# Patient Record
Sex: Female | Born: 2005 | Race: Black or African American | Hispanic: No | Smoking: Never smoker
Health system: Southern US, Community
[De-identification: ages and names within clinical notes are randomized; demographics above are authoritative.]

## PROBLEM LIST (undated history)

## (undated) DIAGNOSIS — T300 Burn of unspecified body region, unspecified degree: Secondary | ICD-10-CM

## (undated) DIAGNOSIS — G932 Benign intracranial hypertension: Secondary | ICD-10-CM

## (undated) DIAGNOSIS — R011 Cardiac murmur, unspecified: Secondary | ICD-10-CM

## (undated) DIAGNOSIS — G43909 Migraine, unspecified, not intractable, without status migrainosus: Secondary | ICD-10-CM

## (undated) DIAGNOSIS — K219 Gastro-esophageal reflux disease without esophagitis: Secondary | ICD-10-CM

## (undated) DIAGNOSIS — G039 Meningitis, unspecified: Secondary | ICD-10-CM

## (undated) DIAGNOSIS — E669 Obesity, unspecified: Secondary | ICD-10-CM

## (undated) HISTORY — DX: Obesity, unspecified: E66.9

## (undated) HISTORY — DX: Benign intracranial hypertension: G93.2

## (undated) HISTORY — DX: Gastro-esophageal reflux disease without esophagitis: K21.9

## (undated) HISTORY — DX: Migraine, unspecified, not intractable, without status migrainosus: G43.909

---
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
## 2023-01-01 ENCOUNTER — Encounter: Payer: Self-pay | Admitting: Plastic Surgery

## 2023-01-01 ENCOUNTER — Ambulatory Visit (INDEPENDENT_AMBULATORY_CARE_PROVIDER_SITE_OTHER): Payer: Medicaid Other | Admitting: Plastic Surgery

## 2023-01-01 DIAGNOSIS — L905 Scar conditions and fibrosis of skin: Secondary | ICD-10-CM
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
## 2023-01-18 ENCOUNTER — Ambulatory Visit
Admission: EM | Admit: 2023-01-18 | Discharge: 2023-01-18 | Disposition: A | Discharge status: Home / Self Care | Payer: Medicaid Other | Attending: Internal Medicine | Admitting: Internal Medicine

## 2023-01-18 DIAGNOSIS — M7989 Other specified soft tissue disorders: Secondary | ICD-10-CM

## 2023-01-18 MED ORDER — FUROSEMIDE 40 MG PO TABS
40.0000 mg | ORAL_TABLET | Freq: Once | ORAL | 0 refills | Status: DC
Start: 2023-01-18 — End: 2023-03-24

## 2023-01-18 NOTE — Discharge Instructions (Addendum)
The clinic will contact you with results of blood work done today if positive.  I have sent 1 dose of Lasix to your pharmacy.  Please follow-up with your PCP at your scheduled appointment in 2 days.  Try to avoid salt intake and elevate your legs as needed.  Please go to the emergency room for any worsening symptoms that develop prior to seeing your PCP.  This includes but is not limited to worsening swelling, calf swelling or pain, shortness of breath, chest pain, difficulty breathing when you lay flat, or any new concerns that arise.  I hope you feel better soon!

## 2023-01-18 NOTE — ED Triage Notes (Signed)
Pt presents to UC w/ c/o swelling and pain to bilateral ankles x3 days. Pt states she was on a fair ride 4 days ago and had to "put a lot of weight on her feet."
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
## 2023-01-19 LAB — BASIC METABOLIC PANEL
BUN/Creatinine Ratio: 15 (ref 9–23)
BUN: 11 mg/dL (ref 6–20)
CO2: 21 mmol/L (ref 20–29)
Calcium: 9.3 mg/dL (ref 8.7–10.2)
Chloride: 106 mmol/L (ref 96–106)
Creatinine, Ser: 0.75 mg/dL (ref 0.57–1.00)
Glucose: 83 mg/dL (ref 70–99)
Potassium: 3.9 mmol/L (ref 3.5–5.2)
Sodium: 139 mmol/L (ref 134–144)
eGFR: 118 mL/min/{1.73_m2} (ref >59)

## 2023-01-21 ENCOUNTER — Other Ambulatory Visit (HOSPITAL_COMMUNITY): Payer: Self-pay

## 2023-01-21 ENCOUNTER — Encounter: Payer: Self-pay | Admitting: Nurse Practitioner

## 2023-01-21 ENCOUNTER — Ambulatory Visit (INDEPENDENT_AMBULATORY_CARE_PROVIDER_SITE_OTHER): Payer: Medicaid Other | Admitting: Nurse Practitioner

## 2023-01-21 VITALS — BP 103/58 | HR 92 | Temp 97.2°F | Wt 259.0 lb

## 2023-01-21 DIAGNOSIS — Z23 Encounter for immunization: Secondary | ICD-10-CM | POA: Diagnosis not present

## 2023-01-21 DIAGNOSIS — R600 Localized edema: Secondary | ICD-10-CM | POA: Diagnosis not present

## 2023-01-21 DIAGNOSIS — K219 Gastro-esophageal reflux disease without esophagitis: Secondary | ICD-10-CM | POA: Diagnosis not present

## 2023-01-21 DIAGNOSIS — Z6841 Body Mass Index (BMI) 40.0 and over, adult: Secondary | ICD-10-CM

## 2023-01-21 MED ORDER — PANTOPRAZOLE SODIUM 40 MG PO TBEC
40.0000 mg | DELAYED_RELEASE_TABLET | Freq: Every day | ORAL | 3 refills | Status: DC
Start: 2023-01-21 — End: 2023-03-24
  Filled 2023-01-21: qty 30, 30d supply, fill #0

## 2023-01-21 MED ORDER — WEGOVY 0.25 MG/0.5ML ~~LOC~~ SOAJ
0.2500 mg | SUBCUTANEOUS | 0 refills | Status: DC
  Filled 2023-01-21: qty 2, 28d supply, fill #0

## 2023-01-21 NOTE — Assessment & Plan Note (Signed)
Pantoprazole 40 mg daily refilled Takes famotidine 20 mg twice daily as needed

## 2023-01-21 NOTE — Patient Instructions (Signed)
Please avoid salty diet, keep your legs elevated when sitting, avoid prolonged sitting or standing ,wear compression as needed to help prevent leg swelling  Please start taking Wegovy 0.25 mg once weekly injection as discussed  It is important that you exercise regularly at least 30 minutes 5 times a week as tolerated  Think about what you will eat, plan ahead. Choose " clean, green, fresh or frozen" over canned, processed or packaged foods which are more sugary, salty and fatty. 70 to 75% of food eaten should be vegetables and fruit. Three meals at set times with snacks allowed between meals, but they must be fruit or vegetables. Aim to eat over a 12 hour period , example 7 am to 7 pm, and STOP after  your last meal of the day. Drink water,generally about 64 ounces per day, no other drink is as healthy. Fruit juice is best enjoyed in a healthy way, by EATING the fruit.  Thanks for choosing [facility] we consider it a privelige to serve you.
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
## 2023-01-21 NOTE — Assessment & Plan Note (Signed)
No edema noted on examination today Encouraged to avoid salty foods keep legs elevated when sitting Wear compression socks and change positions frequently to help prevent edema

## 2023-01-21 NOTE — Assessment & Plan Note (Addendum)
Wt Readings from Last 3 Encounters:  01/21/23 259 lb (117.5 kg) (>99%, Z= 2.56)*  12/24/22 257 lb (116.6 kg) (>99%, Z= 2.54)*  12/17/22 254 lb (115.2 kg) (>99%, Z= 2.52)*   * Growth percentiles are based on CDC (Girls, 2-20 Years) data.   Body mass index is 44.46 kg/m.  Ozempic available at the [facility] medication sent over today Encouraged to start taking Ozempic 0.25 mg once weekly Patient counseled on low-carb modified diet Encouraged engage in regular moderate to vigorous exercise at least 150 minutes weekly Follow-up in 4 weeks

## 2023-01-28 ENCOUNTER — Ambulatory Visit: Payer: Medicaid Other

## 2023-02-03 ENCOUNTER — Ambulatory Visit: Payer: Medicaid Other | Admitting: Neurology

## 2023-02-11 ENCOUNTER — Ambulatory Visit: Payer: Medicaid Other

## 2023-02-18 ENCOUNTER — Ambulatory Visit: Payer: Self-pay | Admitting: Nurse Practitioner

## 2023-02-24 ENCOUNTER — Encounter: Payer: Self-pay | Admitting: Nurse Practitioner

## 2023-02-24 ENCOUNTER — Ambulatory Visit (INDEPENDENT_AMBULATORY_CARE_PROVIDER_SITE_OTHER): Payer: Medicaid Other | Admitting: Nurse Practitioner

## 2023-02-24 ENCOUNTER — Encounter (HOSPITAL_COMMUNITY): Payer: Self-pay

## 2023-02-24 ENCOUNTER — Other Ambulatory Visit (HOSPITAL_COMMUNITY): Payer: Self-pay

## 2023-02-24 ENCOUNTER — Ambulatory Visit: Payer: Self-pay | Admitting: Nurse Practitioner

## 2023-02-24 ENCOUNTER — Other Ambulatory Visit: Payer: Self-pay

## 2023-02-24 VITALS — BP 113/56 | HR 83 | Temp 97.4°F | Ht 64.0 in | Wt 255.0 lb

## 2023-02-24 DIAGNOSIS — H547 Unspecified visual loss: Secondary | ICD-10-CM | POA: Diagnosis not present

## 2023-02-24 DIAGNOSIS — R112 Nausea with vomiting, unspecified: Secondary | ICD-10-CM

## 2023-02-24 DIAGNOSIS — E66813 Obesity, class 3: Secondary | ICD-10-CM | POA: Diagnosis not present

## 2023-02-24 DIAGNOSIS — Z0000 Encounter for general adult medical examination without abnormal findings: Secondary | ICD-10-CM | POA: Diagnosis not present

## 2023-02-24 DIAGNOSIS — Z6841 Body Mass Index (BMI) 40.0 and over, adult: Secondary | ICD-10-CM

## 2023-02-24 DIAGNOSIS — Z13220 Encounter for screening for lipoid disorders: Secondary | ICD-10-CM

## 2023-02-24 DIAGNOSIS — R110 Nausea: Secondary | ICD-10-CM

## 2023-02-24 HISTORY — DX: Nausea with vomiting, unspecified: R11.2

## 2023-02-24 MED ORDER — WEGOVY 0.5 MG/0.5ML ~~LOC~~ SOAJ
0.5000 mg | SUBCUTANEOUS | 0 refills | Status: AC
  Filled 2023-02-24 – 2023-02-25 (×3): qty 2, 28d supply, fill #0

## 2023-02-24 MED ORDER — ONDANSETRON HCL 4 MG PO TABS
4.0000 mg | ORAL_TABLET | Freq: Three times a day (TID) | ORAL | 0 refills | Status: DC | PRN
Start: 2023-02-24 — End: 2023-03-17
  Filled 2023-02-24: qty 10, 4d supply, fill #0

## 2023-02-24 MED ORDER — WEGOVY 1 MG/0.5ML ~~LOC~~ SOAJ
1.0000 mg | SUBCUTANEOUS | 0 refills | Status: DC
Start: 2023-04-01 — End: 2023-05-04
  Filled 2023-02-24 – 2023-04-02 (×3): qty 2, 28d supply, fill #0
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
## 2023-02-24 NOTE — Patient Instructions (Signed)
1. Annual physical exam   2. Screening for lipid disorders  - Lipid panel  3. Class 3 severe obesity due to excess calories with serious comorbidity and body mass index (BMI) of 40.0 to 44.9 in adult (HCC)  - Semaglutide-Weight Management (WEGOVY) 0.5 MG/0.5ML SOAJ; Inject 0.5 mg into the skin once a week for 28 days.  Dispense: 2 mL; Refill: 0 - Semaglutide-Weight Management (WEGOVY) 1 MG/0.5ML SOAJ; Inject 1 mg into the skin once a week for 28 days.  Dispense: 2 mL; Refill: 0     It is important that you exercise regularly at least 30 minutes 5 times a week as tolerated  Think about what you will eat, plan ahead. Choose " clean, green, fresh or frozen" over canned, processed or packaged foods which are more sugary, salty and fatty. 70 to 75% of food eaten should be vegetables and fruit. Three meals at set times with snacks allowed between meals, but they must be fruit or vegetables. Aim to eat over a 12 hour period , example 7 am to 7 pm, and STOP after  your last meal of the day. Drink water,generally about 64 ounces per day, no other drink is as healthy. Fruit juice is best enjoyed in a healthy way, by EATING the fruit.  Thanks for choosing [facility] we consider it a privelige to serve you.

## 2023-02-24 NOTE — Assessment & Plan Note (Signed)
Annual exam as documented.  Counseling done include healthy lifestyle involving committing to 150 minutes of exercise per week, heart healthy diet, and attaining healthy weight. The importance of adequate sleep also discussed.  Regular use of seat belt and home safety were also discussed . Changes in health habits are decided on by patient with goals and time frames set for achieving them. Immunization screening  needs are specifically addressed at this visit.       Screening for lipid disorders  - Lipid panel

## 2023-02-24 NOTE — Assessment & Plan Note (Signed)
Wt Readings from Last 3 Encounters:  02/24/23 255 lb (115.7 kg) (>99%, Z= 2.54)*  01/21/23 259 lb (117.5 kg) (>99%, Z= 2.56)*  12/24/22 257 lb (116.6 kg) (>99%, Z= 2.54)*   * Growth percentiles are based on CDC (Girls, 2-20 Years) data.   Body mass index is 43.77 kg/m.  She has lost 4 pounds since her last visit Patient encouraged to continue to intensify her efforts at losing weight Start  - Semaglutide-Weight Management (WEGOVY) 0.5 MG/0.5ML SOAJ; Inject 0.5 mg into the skin once a week for 28 days.  Dispense: 2 mL; Refill: 0 - Semaglutide-Weight Management (WEGOVY) 1 MG/0.5ML SOAJ; Inject 1 mg into the skin once a week for 28 days.  Dispense: 2 mL; Refill: 0  Patient counseled on low-carb modified diet encouraged to engage in regular moderate to vigorous exercise at least 150 minutes weekly Follow-up in 3 months

## 2023-02-24 NOTE — Assessment & Plan Note (Signed)
Patient encouraged to follow-up with the ophthalmologist

## 2023-02-24 NOTE — Assessment & Plan Note (Signed)
ondansetron (ZOFRAN) 4 MG tablet; Take 1 tablet (4 mg total) by mouth every 8 (eight) hours as needed for nausea or vomiting.  Dispense: 10 tablet; Refill: 0  Patient encouraged to avoid fatty fried food, eat smaller portion of meals to help prevent nausea while taking [facility]

## 2023-02-25 ENCOUNTER — Other Ambulatory Visit (HOSPITAL_COMMUNITY): Payer: Self-pay

## 2023-02-25 ENCOUNTER — Other Ambulatory Visit: Payer: Self-pay

## 2023-02-25 LAB — LIPID PANEL
Chol/HDL Ratio: 3.9 {ratio} (ref 0.0–4.4)
Cholesterol, Total: 143 mg/dL (ref 100–169)
HDL: 37 mg/dL — ABNORMAL LOW (ref >39)
LDL Chol Calc (NIH): 92 mg/dL (ref 0–109)
Triglycerides: 68 mg/dL (ref 0–89)
VLDL Cholesterol Cal: 14 mg/dL (ref 5–40)

## 2023-02-26 ENCOUNTER — Other Ambulatory Visit: Payer: Self-pay

## 2023-02-27 ENCOUNTER — Other Ambulatory Visit (HOSPITAL_BASED_OUTPATIENT_CLINIC_OR_DEPARTMENT_OTHER): Payer: Self-pay

## 2023-03-02 ENCOUNTER — Other Ambulatory Visit (HOSPITAL_COMMUNITY): Payer: Self-pay

## 2023-03-04 ENCOUNTER — Other Ambulatory Visit: Payer: Self-pay

## 2023-03-05 ENCOUNTER — Ambulatory Visit (INDEPENDENT_AMBULATORY_CARE_PROVIDER_SITE_OTHER): Payer: Medicaid Other

## 2023-03-05 ENCOUNTER — Ambulatory Visit
Admission: EM | Admit: 2023-03-05 | Discharge: 2023-03-05 | Disposition: A | Discharge status: Home / Self Care | Payer: Medicaid Other | Attending: Internal Medicine | Admitting: Internal Medicine

## 2023-03-05 DIAGNOSIS — J069 Acute upper respiratory infection, unspecified: Secondary | ICD-10-CM | POA: Diagnosis not present

## 2023-03-05 DIAGNOSIS — R0602 Shortness of breath: Secondary | ICD-10-CM | POA: Insufficient documentation

## 2023-03-05 DIAGNOSIS — J029 Acute pharyngitis, unspecified: Secondary | ICD-10-CM | POA: Insufficient documentation

## 2023-03-05 DIAGNOSIS — R051 Acute cough: Secondary | ICD-10-CM

## 2023-03-05 DIAGNOSIS — Z1152 Encounter for screening for COVID-19: Secondary | ICD-10-CM | POA: Insufficient documentation

## 2023-03-05 LAB — POCT RAPID STREP A (OFFICE): Rapid Strep A Screen: NEGATIVE

## 2023-03-05 MED ORDER — BENZONATATE 200 MG PO CAPS: 200.0000 mg | ORAL_CAPSULE | Freq: Three times a day (TID) | ORAL | 0 refills | Status: DC | PRN

## 2023-03-05 MED ORDER — ALBUTEROL SULFATE HFA 108 (90 BASE) MCG/ACT IN AERS
1.0000 | INHALATION_SPRAY | Freq: Four times a day (QID) | RESPIRATORY_TRACT | 0 refills | Status: AC | PRN
Start: 2023-03-05 — End: ?

## 2023-03-05 NOTE — Discharge Instructions (Signed)
The clinic will contact you with results of the COVID test done today if positive.  May use albuterol inhaler as needed for shortness of breath.  Tessalon as needed for cough.  Lots of rest and fluids.  Please follow-up with your PCP in 2 to 3 days for recheck.  Please go to the ER for any worsening symptoms.  I hope you feel better soon!

## 2023-03-05 NOTE — ED Triage Notes (Signed)
Pt presents with c/o chills, sore throat, body aches x 4 days.  Denies fever. Has no energy, nasal congestion and headaches.   Home interventions:tylenol
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
## 2023-03-06 LAB — SARS CORONAVIRUS 2 (TAT 6-24 HRS): SARS Coronavirus 2: NEGATIVE

## 2023-03-08 LAB — CULTURE, GROUP A STREP (THRC)

## 2023-03-16 ENCOUNTER — Other Ambulatory Visit: Payer: Self-pay

## 2023-03-16 ENCOUNTER — Encounter (HOSPITAL_COMMUNITY): Payer: Self-pay | Admitting: Emergency Medicine

## 2023-03-16 ENCOUNTER — Emergency Department (HOSPITAL_COMMUNITY)
Admission: EM | Admit: 2023-03-16 | Discharge: 2023-03-16 | Disposition: A | Discharge status: Home / Self Care | Payer: Medicaid Other | Attending: Emergency Medicine | Admitting: Emergency Medicine

## 2023-03-16 DIAGNOSIS — R109 Unspecified abdominal pain: Secondary | ICD-10-CM | POA: Diagnosis present

## 2023-03-16 DIAGNOSIS — K2970 Gastritis, unspecified, without bleeding: Secondary | ICD-10-CM | POA: Insufficient documentation

## 2023-03-16 LAB — COMPREHENSIVE METABOLIC PANEL
ALT: 21 U/L (ref 0–44)
AST: 23 U/L (ref 15–41)
Albumin: 4 g/dL (ref 3.5–5.0)
Alkaline Phosphatase: 50 U/L (ref 38–126)
Anion gap: 13 (ref 5–15)
BUN: 10 mg/dL (ref 6–20)
CO2: 16 mmol/L — ABNORMAL LOW (ref 22–32)
Calcium: 9.4 mg/dL (ref 8.9–10.3)
Chloride: 106 mmol/L (ref 98–111)
Creatinine, Ser: 0.63 mg/dL (ref 0.44–1.00)
GFR, Estimated: >60 mL/min (ref >60)
Glucose, Bld: 90 mg/dL (ref 70–99)
Potassium: 4.2 mmol/L (ref 3.5–5.1)
Sodium: 135 mmol/L (ref 135–145)
Total Bilirubin: 1 mg/dL (ref <1.2)
Total Protein: 8 g/dL (ref 6.5–8.1)

## 2023-03-16 LAB — CBC WITH DIFFERENTIAL/PLATELET
Abs Immature Granulocytes: 0.02 10*3/uL (ref 0.00–0.07)
Basophils Absolute: 0 10*3/uL (ref 0.0–0.1)
Basophils Relative: 0 %
Eosinophils Absolute: 0 10*3/uL (ref 0.0–0.5)
Eosinophils Relative: 0 %
HCT: 44.2 % (ref 36.0–46.0)
Hemoglobin: 14 g/dL (ref 12.0–15.0)
Immature Granulocytes: 0 %
Lymphocytes Relative: 21 %
Lymphs Abs: 1.7 10*3/uL (ref 0.7–4.0)
MCH: 29.4 pg (ref 26.0–34.0)
MCHC: 31.7 g/dL (ref 30.0–36.0)
MCV: 92.9 fL (ref 80.0–100.0)
Monocytes Absolute: 0.4 10*3/uL (ref 0.1–1.0)
Monocytes Relative: 5 %
Neutro Abs: 5.9 10*3/uL (ref 1.7–7.7)
Neutrophils Relative %: 74 %
Platelets: 216 10*3/uL (ref 150–400)
RBC: 4.76 MIL/uL (ref 3.87–5.11)
RDW: 12.1 % (ref 11.5–15.5)
WBC: 8 10*3/uL (ref 4.0–10.5)
nRBC: 0 % (ref 0.0–0.2)

## 2023-03-16 LAB — LIPASE, BLOOD: Lipase: 25 U/L (ref 11–51)

## 2023-03-16 MED ORDER — HYDROMORPHONE HCL 1 MG/ML IJ SOLN
0.5000 mg | Freq: Once | INTRAMUSCULAR | Status: AC
  Administered 2023-03-16: 0.5 mg via INTRAVENOUS
  Filled 2023-03-16: qty 1

## 2023-03-16 MED ORDER — METOCLOPRAMIDE HCL 5 MG/ML IJ SOLN
5.0000 mg | Freq: Once | INTRAMUSCULAR | Status: AC
Start: 2023-03-16 — End: 2023-03-16
  Administered 2023-03-16: 5 mg via INTRAVENOUS
  Filled 2023-03-16: qty 2

## 2023-03-16 MED ORDER — ALUM & MAG HYDROXIDE-SIMETH 200-200-20 MG/5ML PO SUSP
30.0000 mL | Freq: Once | ORAL | Status: AC
  Administered 2023-03-16: 30 mL via ORAL
  Filled 2023-03-16: qty 30

## 2023-03-16 MED ORDER — LIDOCAINE VISCOUS HCL 2 % MT SOLN
15.0000 mL | Freq: Once | OROMUCOSAL | Status: AC
  Administered 2023-03-16: 15 mL via ORAL
  Filled 2023-03-16: qty 15

## 2023-03-16 MED ORDER — SUCRALFATE 1 G PO TABS
1.0000 g | ORAL_TABLET | Freq: Three times a day (TID) | ORAL | 0 refills | Status: AC
Start: 2023-03-16 — End: 2023-06-10

## 2023-03-16 MED ORDER — DIPHENHYDRAMINE HCL 50 MG/ML IJ SOLN
25.0000 mg | Freq: Once | INTRAMUSCULAR | Status: AC
  Administered 2023-03-16: 25 mg via INTRAVENOUS
  Filled 2023-03-16: qty 1

## 2023-03-16 MED ORDER — FAMOTIDINE IN NACL 20-0.9 MG/50ML-% IV SOLN
20.0000 mg | Freq: Once | INTRAVENOUS | Status: AC
  Administered 2023-03-16: 20 mg via INTRAVENOUS
  Filled 2023-03-16: qty 50

## 2023-03-16 MED ORDER — SUCRALFATE 1 GM/10ML PO SUSP
1.0000 g | Freq: Three times a day (TID) | ORAL | Status: DC
  Administered 2023-03-16: 1 g via ORAL
  Filled 2023-03-16: qty 10

## 2023-03-16 NOTE — ED Provider Triage Note (Signed)
Emergency Medicine Provider Triage Evaluation Note  Natasha Sampson , a 17 y.o. female  was evaluated in triage.  Pt complains of generalized abdominal pain and burning epigastric pain that started yesterday following eating spaghetti with red sauce.  She reports that she forgot to take her omeprazole as prescribed that day.  She tried taking Meprazole following spaghetti with no improvement to her symptoms.  She then tried to take Pepcid this morning with no improvement to her symptoms and now has generalized abdominal pain and a "burning" in her chest  Review of Systems  Positive: Generalized abdominal pain, "burning" epigastric pain with nausea Negative: Vomiting diarrhea fever shortness of breath  Physical Exam  BP 124/86 (BP Location: Left Arm)   Pulse 85   Temp 98 F (36.7 C) (Oral)   Resp 17   Ht 5\' 4"  (1.626 m)   Wt 115.7 kg   LMP 02/11/2023 (Exact Date)   SpO2 100%   BMI 43.77 kg/m  Gen:   Awake, no distress   Resp:  Normal effort  MSK:   Moves extremities without difficulty  Other:    Medical Decision Making  Medically screening exam initiated at 2:17 PM.  Appropriate orders placed.  Natasha Sampson was informed that the remainder of the evaluation will be completed by another provider, this initial triage assessment does not replace that evaluation, and the importance of remaining in the ED until their evaluation is complete.     Natasha Sheen, PA 03/16/23 1419

## 2023-03-16 NOTE — ED Triage Notes (Signed)
Patient arrives in wheelchair by POV c/o GERD last night after eating spaghetti. C/o burning sensation in throat and took protonix last night around 3am. States this morning tried drinking some tea and took pepcid.

## 2023-03-16 NOTE — ED Provider Notes (Signed)
[place] EMERGENCY DEPARTMENT AT [facility] Provider Note   CSN: [id number] Arrival date & time: 03/16/23  1317     History  Chief Complaint  Patient presents with   Gastroesophageal Reflux    Natasha Sampson is a 17 y.o. female with hx of reflux gastritis, obesity, presenting to ED with abdominal pain.  Patient reports that she ate spaghetti with sauce last night and thinks that this "set off" her gastritis.  She typically takes pantoprazole as well as Pepcid and took his medicine as well as other over-the-counter medicines last night but has had severe unrelenting burning epigastric pain.  She says the pain is 10 out of 10.  She was nauseated and dry heaves and vomited earlier today  HPI     Home Medications Prior to Admission medications   Medication Sig Start Date End Date Taking? Authorizing Provider  sucralfate (CARAFATE) 1 g tablet Take 1 tablet (1 g total) by mouth 3 (three) times daily. Take with meals 03/16/23 04/15/23 Yes Camala Talwar, Kermit Balo, MD  acetaZOLAMIDE ER (DIAMOX) 500 MG capsule Take 1 capsule (500 mg total) by mouth 2 (two) times daily. 12/17/22 12/12/23  Windell Norfolk, MD  albuterol (VENTOLIN HFA) 108 (90 Base) MCG/ACT inhaler Inhale 1-2 puffs into the lungs every 6 (six) hours as needed for wheezing or shortness of breath. 03/05/23   Radford Pax, NP  benzonatate (TESSALON) 200 MG capsule Take 1 capsule (200 mg total) by mouth 3 (three) times daily as needed. 03/05/23   Radford Pax, NP  famotidine (PEPCID) 20 MG tablet Take 20 mg by mouth 2 (two) times daily as needed for heartburn or indigestion. 09/24/22   [provider]  furosemide (LASIX) 40 MG tablet Take 1 tablet (40 mg total) by mouth once for 1 dose. Patient not taking: Reported on 01/21/2023 01/18/23 01/21/23  Radford Pax, NP  ondansetron (ZOFRAN) 4 MG tablet Take 1 tablet (4 mg total) by mouth every 8 (eight) hours as needed for nausea or vomiting. 02/24/23   Paseda, Baird Kay, FNP   pantoprazole (PROTONIX) 40 MG tablet Take 1 tablet (40 mg total) by mouth daily. 01/21/23   Donell Beers, FNP  Semaglutide-Weight Management (WEGOVY) 0.5 MG/0.5ML SOAJ Inject 0.5 mg into the skin once a week for 28 days. 02/25/23 03/30/23  Donell Beers, FNP  Semaglutide-Weight Management (WEGOVY) 1 MG/0.5ML SOAJ Inject 1 mg into the skin once a week for 28 days. 04/01/23 04/29/23  Donell Beers, FNP  SUMAtriptan (IMITREX) 100 MG tablet Take 1 tablet (100 mg total) by mouth every 2 (two) hours as needed for migraine. May repeat in 2 hours if headache persists or recurs. 12/17/22   Windell Norfolk, MD  topiramate (TOPAMAX) 100 MG tablet Take 1 tablet (100 mg total) by mouth 2 (two) times daily. 12/17/22 09/13/23  Windell Norfolk, MD  TYLENOL 500 MG tablet Take 500-1,000 mg by mouth every 6 (six) hours as needed for mild pain or headache.    [provider]      Allergies    Gadavist [gadobutrol]    Review of Systems   Review of Systems  Physical Exam Updated Vital Signs BP (!) 120/96 (BP Location: Right Arm)   Pulse 80   Temp 98.8 F (37.1 C) (Oral)   Resp 18   Ht 5\' 4"  (1.626 m)   Wt 115.7 kg   LMP 02/11/2023 (Exact Date)   SpO2 100%   BMI 43.77 kg/m  Physical Exam  Constitutional:      General: She is in acute distress.     Appearance: She is obese.     Comments: Tearful  HENT:     Head: Normocephalic and atraumatic.  Eyes:     Conjunctiva/sclera: Conjunctivae normal.     Pupils: Pupils are equal, round, and reactive to light.  Cardiovascular:     Rate and Rhythm: Regular rhythm. Tachycardia present.  Pulmonary:     Effort: Pulmonary effort is normal. No respiratory distress.  Abdominal:     General: There is no distension.     Tenderness: There is abdominal tenderness.  Skin:    General: Skin is warm and dry.  Neurological:     General: No focal deficit present.     Mental Status: She is alert. Mental status is at baseline.  Psychiatric:         Mood and Affect: Mood normal.        Behavior: Behavior normal.     ED Results / Procedures / Treatments   Labs (all labs ordered are listed, but only abnormal results are displayed) Labs Reviewed  COMPREHENSIVE METABOLIC PANEL - Abnormal; Notable for the following components:      Result Value   CO2 16 (*)    All other components within normal limits  CBC WITH DIFFERENTIAL/PLATELET  LIPASE, BLOOD  HCG, SERUM, QUALITATIVE    EKG None  Radiology No results found.  Procedures Procedures    Medications Ordered in ED Medications  sucralfate (CARAFATE) 1 GM/10ML suspension 1 g (1 g Oral Given 03/16/23 1719)  alum & mag hydroxide-simeth (MAALOX/MYLANTA) 200-200-20 MG/5ML suspension 30 mL (30 mLs Oral Given 03/16/23 1428)    And  lidocaine (XYLOCAINE) 2 % viscous mouth solution 15 mL (15 mLs Oral Given 03/16/23 1428)  famotidine (PEPCID) IVPB 20 mg premix (0 mg Intravenous Stopped 03/16/23 1934)  HYDROmorphone (DILAUDID) injection 0.5 mg (0.5 mg Intravenous Given 03/16/23 1717)  metoCLOPramide (REGLAN) injection 5 mg (5 mg Intravenous Given 03/16/23 1717)  diphenhydrAMINE (BENADRYL) injection 25 mg (25 mg Intravenous Given 03/16/23 1738)    ED Course/ Medical Decision Making/ A&P Clinical Course as of 03/16/23 2349  Mon Mar 16, 2023  1907 Patient is feeling significantly better after medications and easily tolerating p.o.  I will discharge her at this time.  Will prescribe Carafate in addition to her PPI and H2 blocker that she is currently taking [MT]    Clinical Course User Index [MT] Aislyn Hayse, Kermit Balo, MD                                 Medical Decision Making Amount and/or Complexity of Data Reviewed Labs: ordered.  Risk Prescription drug management.   This patient presents to the ED with concern for worsening epigastric pain. This involves an extensive number of treatment options, and is a complaint that carries with it a high risk of complications and morbidity.  The  differential diagnosis includes gastritis versus peptic ulcer versus esophagitis versus pancreatitis versus acute biliary disease versus other  Co-morbidities that complicate the patient evaluation: History of reflux at high risk of gastric irritation  Additional history obtained from patient's spouse at bedside  I ordered and personally interpreted labs.  The pertinent results include: No emergent findings.  Pregnancy test hemolyzed, but patient is not report any concern for pregnancy at this time, and I do not believe that this is the likely cause of  her symptoms  The patient was given a GI cocktail with Maalox and viscous lidocaine in triage.  She reports no improvement with this medication.  Subsequently I have ordered IV medications including IV Pepcid, small dose 0.5 mg hydromorphone, 5 mg Reglan, and liquid Carafate  I have reviewed the patients home medicines and have made adjustments as needed  Test Considered: She was reporting some chest discomfort after vomiting and more suspect is related to esophageal irritation, have a lower suspicion for ACS, PE, pneumothorax, or other life-threatening cardiothoracic issue. I do not see an indication for x-ray imaging or CT angiogram at this time  After the interventions noted above, I reevaluated the patient and found that they have: improved   Dispostion:  After consideration of the diagnostic results and the patients response to treatment, I feel that the patent would benefit from outpatient follow-up.  I recommended GI follow-up given the degree and severity of her ongoing reflux symptoms and gastritis.         Final Clinical Impression(s) / ED Diagnoses Final diagnoses:  Gastritis, presence of bleeding unspecified, unspecified chronicity, unspecified gastritis type    Rx / DC Orders ED Discharge Orders          Ordered    sucralfate (CARAFATE) 1 g tablet  3 times daily        03/16/23 1908              Terald Sleeper, MD 03/16/23 2349

## 2023-03-17 ENCOUNTER — Telehealth: Payer: Medicaid Other | Admitting: Physician Assistant

## 2023-03-17 DIAGNOSIS — R112 Nausea with vomiting, unspecified: Secondary | ICD-10-CM | POA: Diagnosis not present

## 2023-03-17 MED ORDER — ONDANSETRON 4 MG PO TBDP: 4.0000 mg | ORAL_TABLET | Freq: Three times a day (TID) | ORAL | 0 refills | Status: DC | PRN

## 2023-03-17 NOTE — Progress Notes (Signed)
Virtual Visit Consent   Natasha Sampson, you are scheduled for a virtual visit with a [facility] provider today. Just as with appointments in the office, your consent must be obtained to participate. Your consent will be active for this visit and any virtual visit you may have with one of our providers in the next 365 days. If you have a MyChart account, a copy of this consent can be sent to you electronically.  As this is a virtual visit, video technology does not allow for your provider to perform a traditional examination. This may limit your provider's ability to fully assess your condition. If your provider identifies any concerns that need to be evaluated in person or the need to arrange testing (such as labs, EKG, etc.), we will make arrangements to do so. Although advances in technology are sophisticated, we cannot ensure that it will always work on either your end or our end. If the connection with a video visit is poor, the visit may have to be switched to a telephone visit. With either a video or telephone visit, we are not always able to ensure that we have a secure connection.  By engaging in this virtual visit, you consent to the provision of healthcare and authorize for your insurance to be billed (if applicable) for the services provided during this visit. Depending on your insurance coverage, you may receive a charge related to this service.  I need to obtain your verbal consent now. Are you willing to proceed with your visit today? [name] has provided verbal consent on 03/20/2023 for a virtual visit (video or telephone). Margaretann Loveless, PA-C  Date: 03/20/2023 7:45 AM  Virtual Visit via Video Note   I, Margaretann Loveless, connected with  [name]  ([id number], 2006/02/24) on 03/20/23 at  9:30 AM EST by a video-enabled telemedicine application and verified that I am speaking with the correct person using two identifiers.  Location: Patient: Virtual Visit Location  Patient: Home Provider: Virtual Visit Location Provider: Home Office   I discussed the limitations of evaluation and management by telemedicine and the availability of in person appointments. The patient expressed understanding and agreed to proceed.    History of Present Illness: Natasha Sampson is a 17 y.o. who identifies as a female who was assigned female at birth, and is being seen today for nausea and vomiting in the setting of possible acute gastritis. Has been having issues of gastritis/PUD/GERD for a while now. Was seen in the ER last night, 03/16/23, for the same issue. She was prescribed Sucralfate to start taking along with her Pantoprazole. It was also recommended for her to follow up with GI due to frequency and severity of her gastritis symptoms. Since the ER visit she has continued to have frequent nausea, vomiting, and diarrhea. Even struggling to keep down liquids. She has take regular zofran but has vomited that up. She has taken 1 sucralfate, but that did not stay down either. She has been scared to take pantoprazole and sucralfate on an empty stomach. She does have increased fatigue and reports feeling hungry and thirsty. Denies hematochezia, melena, hematemesis.   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   Need for influenza vaccination 05/30/2022   Gastroesophageal reflux disease 05/30/2022   Anxiety and depression 05/30/2022   Menorrhagia with regular cycle 05/30/2022   Migraine without aura and without status migrainosus, not intractable 05/30/2022   History of seizures as a child 05/30/2022   Obesity 05/30/2022   Blurry vision,  bilateral 05/30/2022   Severe episode of recurrent major depressive disorder, without psychotic features (HCC) 05/13/2019   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   Altered mental status 01/06/2013   Muscular pain 01/06/2013   Fever, unspecified 01/06/2013   Headache 01/06/2013   Transient alteration of awareness 01/06/2013    Class: Acute    Allergies:  Allergies  Allergen Reactions   Gadavist [Gadobutrol] Other (See Comments)    Sneezing,watery eyes- post injection   Medications:  Current Outpatient Medications:    ondansetron (ZOFRAN-ODT) 4 MG disintegrating tablet, Take 1-2 tablets (4-8 mg total) by mouth every 8 (eight) hours as needed., Disp: 20 tablet, Rfl: 0   acetaZOLAMIDE ER (DIAMOX) 500 MG capsule, Take 1 capsule (500 mg total) by mouth 2 (two) times daily., Disp: 180 capsule, Rfl: 3   albuterol (VENTOLIN HFA) 108 (90 Base) MCG/ACT inhaler, Inhale 1-2 puffs into the lungs every 6 (six) hours as needed for wheezing or shortness of breath., Disp: 1 each, Rfl: 0   benzonatate (TESSALON) 200 MG capsule, Take 1 capsule (200 mg total) by mouth 3 (three) times daily as needed., Disp: 20 capsule, Rfl: 0   famotidine (PEPCID) 20 MG tablet, Take 20 mg by mouth 2 (two) times daily as needed for heartburn or indigestion., Disp: , Rfl:    furosemide (LASIX) 40 MG tablet, Take 1 tablet (40 mg total) by mouth once for 1 dose. (Patient not taking: Reported on 01/21/2023), Disp: 1 tablet, Rfl: 0   pantoprazole (PROTONIX) 40 MG tablet, Take 1 tablet (40 mg total) by mouth daily., Disp: 30 tablet, Rfl: 3   Semaglutide-Weight Management (WEGOVY) 0.5 MG/0.5ML SOAJ, Inject 0.5 mg into the skin once a week for 28 days., Disp: 2 mL, Rfl: 0   [START ON 04/01/2023] Semaglutide-Weight Management (WEGOVY) 1 MG/0.5ML SOAJ, Inject 1 mg into the skin once a week for 28 days., Disp: 2 mL, Rfl: 0   sucralfate (CARAFATE) 1 g tablet, Take 1 tablet (1 g total) by mouth 3 (three) times daily. Take with meals, Disp: 90  tablet, Rfl: 0   SUMAtriptan (IMITREX) 100 MG tablet, Take 1 tablet (100 mg total) by mouth every 2 (two) hours as needed for migraine. May repeat in 2 hours if headache persists or recurs., Disp: 10 tablet, Rfl: 3   topiramate (TOPAMAX) 100 MG tablet, Take 1 tablet (100 mg total) by mouth 2 (two) times daily., Disp: 180 tablet, Rfl: 2   TYLENOL 500 MG tablet, Take 500-1,000 mg by mouth every 6 (six) hours as needed for mild pain or headache., Disp: , Rfl:   Observations/Objective: Patient is well-developed, well-nourished in no acute distress.  Resting comfortably at home.  Head is normocephalic, atraumatic.  No labored breathing.  Speech is clear and coherent with logical content.  Patient is alert and oriented at baseline.    Assessment and Plan: 1. Nausea and vomiting, unspecified vomiting type - ondansetron (ZOFRAN-ODT) 4 MG disintegrating tablet; Take 1-2 tablets (4-8 mg total) by mouth every 8 (eight) hours as needed.  Dispense: 20 tablet; Refill: 0  - Change Zofran to ODT - Continue Sucralfate and Pantoprazole - Liquid  diet today, push fluids, electrolyte beverages - Increase diet slowly to bland diet tomorrow then increase as tolerated back to normal - Discussed following up with PCP for GI referral - Strict ER precautions to return if unable to maintain fluids over the next 24 hours despite change in medication  Follow Up Instructions: I discussed the assessment and treatment plan with the patient. The patient was provided an opportunity to ask questions and all were answered. The patient agreed with the plan and demonstrated an understanding of the instructions.  A copy of instructions were sent to the patient via MyChart unless otherwise noted below.    The patient was advised to call back or seek an in-person evaluation if the symptoms worsen or if the condition fails to improve as anticipated.    Margaretann Loveless, PA-C

## 2023-03-20 NOTE — Patient Instructions (Signed)
Bluford Kaufmann, thank you for joining Margaretann Loveless, PA-C for today's virtual visit.  While this provider is not your primary care provider (PCP), if your PCP is located in our provider database this encounter information will be shared with them immediately following your visit.   A [facility] MyChart account gives you access to today's visit and all your visits, tests, and labs performed at [facility] " click here if you don't have a [facility] MyChart account or go to [email or web address]  Consent: (Patient) Natasha Sampson provided verbal consent for this virtual visit at the beginning of the encounter.  Current Medications:  Current Outpatient Medications:    ondansetron (ZOFRAN-ODT) 4 MG disintegrating tablet, Take 1-2 tablets (4-8 mg total) by mouth every 8 (eight) hours as needed., Disp: 20 tablet, Rfl: 0   acetaZOLAMIDE ER (DIAMOX) 500 MG capsule, Take 1 capsule (500 mg total) by mouth 2 (two) times daily., Disp: 180 capsule, Rfl: 3   albuterol (VENTOLIN HFA) 108 (90 Base) MCG/ACT inhaler, Inhale 1-2 puffs into the lungs every 6 (six) hours as needed for wheezing or shortness of breath., Disp: 1 each, Rfl: 0   benzonatate (TESSALON) 200 MG capsule, Take 1 capsule (200 mg total) by mouth 3 (three) times daily as needed., Disp: 20 capsule, Rfl: 0   famotidine (PEPCID) 20 MG tablet, Take 20 mg by mouth 2 (two) times daily as needed for heartburn or indigestion., Disp: , Rfl:    furosemide (LASIX) 40 MG tablet, Take 1 tablet (40 mg total) by mouth once for 1 dose. (Patient not taking: Reported on 01/21/2023), Disp: 1 tablet, Rfl: 0   pantoprazole (PROTONIX) 40 MG tablet, Take 1 tablet (40 mg total) by mouth daily., Disp: 30 tablet, Rfl: 3   Semaglutide-Weight Management (WEGOVY) 0.5 MG/0.5ML SOAJ, Inject 0.5 mg into the skin once a week for 28 days., Disp: 2 mL, Rfl: 0   [START ON 04/01/2023] Semaglutide-Weight Management (WEGOVY) 1 MG/0.5ML SOAJ, Inject 1 mg into  the skin once a week for 28 days., Disp: 2 mL, Rfl: 0   sucralfate (CARAFATE) 1 g tablet, Take 1 tablet (1 g total) by mouth 3 (three) times daily. Take with meals, Disp: 90 tablet, Rfl: 0   SUMAtriptan (IMITREX) 100 MG tablet, Take 1 tablet (100 mg total) by mouth every 2 (two) hours as needed for migraine. May repeat in 2 hours if headache persists or recurs., Disp: 10 tablet, Rfl: 3   topiramate (TOPAMAX) 100 MG tablet, Take 1 tablet (100 mg total) by mouth 2 (two) times daily., Disp: 180 tablet, Rfl: 2   TYLENOL 500 MG tablet, Take 500-1,000 mg by mouth every 6 (six) hours as needed for mild pain or headache., Disp: , Rfl:    Medications ordered in this encounter:  Meds ordered this encounter  Medications   ondansetron (ZOFRAN-ODT) 4 MG disintegrating tablet    Sig: Take 1-2 tablets (4-8 mg total) by mouth every 8 (eight) hours as needed.    Dispense:  20 tablet    Refill:  0    Order Specific Question:   Supervising Provider    Answer:   Merrilee Jansky [id number]     *If you need refills on other medications prior to your next appointment, please contact your pharmacy*  Follow-Up: Call back or seek an in-person evaluation if the symptoms worsen or if the condition fails to improve as anticipated.  [facility] [phone]  Other Instructions Gastritis, Adult Gastritis is  inflammation of the stomach. There are two kinds of gastritis: Acute gastritis. This kind develops suddenly. Chronic gastritis. This kind is much more common. It develops slowly and lasts for a long time. Gastritis happens when the lining of the stomach becomes weak or gets damaged. Without treatment, gastritis can lead to stomach bleeding and ulcers. What are the causes? This condition may be caused by: An infection. Drinking too much alcohol. Certain medicines. These include steroids, antibiotics, and some over-the-counter medicines, such as aspirin or ibuprofen. Having too much acid in the  stomach. Having a disease of the stomach. Other causes may include: An allergic reaction. Some cancer treatments (radiation). Smoking cigarettes or the use of products that contain nicotine or tobacco. In some cases, the cause of this condition is not known. What increases the risk? Having a disease of the intestines. Having a disease in which the body's immune system attacks the body (autoimmune disease), such as Crohn's disease. Using aspirin or ibuprofen and other NSAIDs to treat other conditions, such as heart disease or chronic pain. Stress. What are the signs or symptoms? Symptoms of this condition include: Pain or a burning sensation in the upper abdomen. Nausea. Vomiting. An uncomfortable feeling of fullness after eating. Weight loss. Bad breath. Blood in your vomit or stool (feces). In some cases, there are no symptoms. How is this diagnosed? This condition may be diagnosed based on your medical history, a physical exam, and tests. Tests may include: Your medical history and a description of your symptoms. A physical exam. Tests. These can include: Blood tests. Stool tests. A test in which a thin, flexible instrument with a light and a camera is passed down the esophagus and into the stomach (upper endoscopy). A test in which a tissue sample is removed to look at it under a microscope (biopsy). How is this treated? This condition may be treated with medicines. The medicines that are used vary depending on the cause of the gastritis. If the condition is caused by a bacterial infection, you may be given antibiotic medicines. If the condition is caused by too much acid in the stomach, you may be given medicines called H2 blockers, proton pump inhibitors, or antacids. Treatment may also involve stopping the use of certain medicines such as aspirin or ibuprofen and other NSAIDs. Follow these instructions at home: Medicines Take over-the-counter and prescription medicines  only as told by your health care provider. If you were prescribed an antibiotic medicine, take it as told by your health care provider. Do not stop taking the antibiotic even if you start to feel better. Alcohol use Do not drink alcohol if: Your health care provider tells you not to drink. You are pregnant, may be pregnant, or are planning to become pregnant. If you drink alcohol: Limit your use to: 0-1 drink a day for women. 0-2 drinks a day for men. Know how much alcohol is in your drink. In the [place] one drink equals one 12 oz bottle of beer (355 mL), one 5 oz glass of wine (148 mL), or one 1 oz glass of hard liquor (44 mL). General instructions  Eat small, frequent meals instead of large meals. Avoid foods and drinks that make your symptoms worse. Talk with your health care provider about ways to manage stress, such as getting regular exercise or practicing deep breathing, meditation, or yoga. Do not use any products that contain nicotine or tobacco. These products include cigarettes, chewing tobacco, and vaping devices, such as e-cigarettes. If you need  help quitting, ask your health care provider. Drink enough fluid to keep your urine pale yellow. Keep all follow-up visits. This is important. Contact a health care provider if: Your symptoms get worse. Your abdominal pain gets worse. Your symptoms return after treatment. You have a fever. Get help right away if: You vomit blood or a substance that looks like coffee grounds. You have black or dark red stools. You are unable to keep fluids down. These symptoms may represent a serious problem that is an emergency. Do not wait to see if the symptoms will go away. Get medical help right away. Call your local emergency services (911 in the [place] Do not drive yourself to the hospital. Summary Gastritis is inflammation of the lining of the stomach that can occur suddenly (acute) or develop slowly over time (chronic). This condition is  diagnosed with a medical history, a physical exam, or tests. This condition may be treated with medicines to treat infection or medicines to reduce the amount of acid in your stomach. Follow your health care provider's instructions about taking medicines, making changes to your diet, and knowing when to call for help. This information is not intended to replace advice given to you by your health care provider. Make sure you discuss any questions you have with your health care provider. Document Revised: 09/01/2020 Document Reviewed: 09/01/2020 Elsevier Patient Education  2024 Elsevier Inc.    If you have been instructed to have an in-person evaluation today at a local Urgent Care facility, please use the link below. It will take you to a list of all of our available [facility], including address, phone number and hours of operation. Please do not delay care.  [facility]  If you or a family member do not have a primary care provider, use the link below to schedule a visit and establish care. When you choose a [facility] primary care physician or advanced practice provider, you gain a long-term partner in health. Find a Primary Care Provider  Learn more about [facility] in-office and virtual care options:  - Get Care Now

## 2023-03-24 ENCOUNTER — Other Ambulatory Visit: Payer: Self-pay

## 2023-03-24 ENCOUNTER — Ambulatory Visit (INDEPENDENT_AMBULATORY_CARE_PROVIDER_SITE_OTHER): Payer: Medicaid Other | Admitting: Nurse Practitioner

## 2023-03-24 ENCOUNTER — Encounter: Payer: Self-pay | Admitting: Nurse Practitioner

## 2023-03-24 VITALS — BP 110/65 | HR 86 | Wt 244.2 lb

## 2023-03-24 DIAGNOSIS — K219 Gastro-esophageal reflux disease without esophagitis: Secondary | ICD-10-CM | POA: Diagnosis not present

## 2023-03-24 DIAGNOSIS — Z09 Encounter for follow-up examination after completed treatment for conditions other than malignant neoplasm: Secondary | ICD-10-CM | POA: Insufficient documentation

## 2023-03-24 MED ORDER — PANTOPRAZOLE SODIUM 40 MG PO TBEC: 40.0000 mg | DELAYED_RELEASE_TABLET | Freq: Every day | ORAL | 3 refills | Status: DC

## 2023-03-24 MED ORDER — FAMOTIDINE 20 MG PO TABS: 20.0000 mg | ORAL_TABLET | Freq: Two times a day (BID) | ORAL | 0 refills | Status: DC | PRN

## 2023-03-24 NOTE — Patient Instructions (Addendum)
Antacids can change the way your body absorbs the other medicines you are taking. It is best to take any other medicine either 1 hour before or 4 hours after you take antacids   Continue pantoprazole 40 mg daily, famotidine 20 mg twice daily You should have enough of refils for pantoprazole at your pharmacy I have referred you to the GI specialist  It is important that you exercise regularly at least 30 minutes 5 times a week as tolerated  Think about what you will eat, plan ahead. Choose " clean, green, fresh or frozen" over canned, processed or packaged foods which are more sugary, salty and fatty. 70 to 75% of food eaten should be vegetables and fruit. Three meals at set times with snacks allowed between meals, but they must be fruit or vegetables. Aim to eat over a 12 hour period , example 7 am to 7 pm, and STOP after  your last meal of the day. Drink water,generally about 64 ounces per day, no other drink is as healthy. Fruit juice is best enjoyed in a healthy way, by EATING the fruit.  Thanks for choosing [facility] we consider it a privelige to serve you.

## 2023-03-24 NOTE — Progress Notes (Signed)
Acute Office Visit  Subjective:     Patient ID: ROZAY BOBB, female    DOB: [birth year], 17 y.o.   MRN: [id number]  Chief Complaint  Patient presents with   Gastroesophageal Reflux   Medication Refill    HPI Patient is in today for complaint of acid reflux.  Patient stated that her acid reflux had gotten worse around the beginning of this month after eating spaghetti with sauce.  At that time she had burning sensation in her chest, nausea, vomiting, abdominal pain she had presented today emergency department was prescribed Carafate that she has been taking as ordered, also taking pantoprazole 40 mg daily and famotidine 20 mg twice daily as needed.  Patient stated that all the symptoms she was having then have resolved.  She would like a referral to the GI specialist     Review of Systems  Constitutional:  Negative for appetite change, chills, fatigue and fever.  HENT:  Negative for congestion, postnasal drip, rhinorrhea and sneezing.   Respiratory:  Negative for cough, shortness of breath and wheezing.   Cardiovascular:  Negative for chest pain, palpitations and leg swelling.  Gastrointestinal:  Positive for abdominal pain. Negative for constipation, nausea and vomiting.  Genitourinary:  Negative for difficulty urinating, dysuria, flank pain and frequency.  Musculoskeletal:  Negative for arthralgias, back pain, joint swelling and myalgias.  Skin:  Negative for color change, pallor, rash and wound.  Neurological:  Negative for dizziness, facial asymmetry, weakness and numbness.  Psychiatric/Behavioral:  Negative for behavioral problems, confusion, self-injury and suicidal ideas.         Objective:    BP 110/65 (BP Location: Right Arm, Patient Position: Sitting, Cuff Size: Normal)   Pulse 86   Wt 244 lb 3.2 oz (110.8 kg)   LMP 02/11/2023 (Exact Date)   SpO2 99%   BMI 41.92 kg/m    Physical Exam Vitals and nursing note reviewed.  Constitutional:      General: She is  not in acute distress.    Appearance: Normal appearance. She is obese. She is not ill-appearing, toxic-appearing or diaphoretic.  HENT:     Mouth/Throat:     Mouth: Mucous membranes are moist.     Pharynx: Oropharynx is clear. No oropharyngeal exudate or posterior oropharyngeal erythema.  Eyes:     General: No scleral icterus.       Right eye: No discharge.        Left eye: No discharge.     Extraocular Movements: Extraocular movements intact.     Conjunctiva/sclera: Conjunctivae normal.  Cardiovascular:     Rate and Rhythm: Normal rate and regular rhythm.     Pulses: Normal pulses.     Heart sounds: Normal heart sounds. No murmur heard.    No friction rub. No gallop.  Pulmonary:     Effort: Pulmonary effort is normal. No respiratory distress.     Breath sounds: Normal breath sounds. No stridor. No wheezing, rhonchi or rales.  Chest:     Chest wall: No tenderness.  Abdominal:     General: There is no distension.     Palpations: Abdomen is soft.     Tenderness: There is no abdominal tenderness. There is no right CVA tenderness, left CVA tenderness or guarding.  Musculoskeletal:        General: No swelling, tenderness, deformity or signs of injury.     Right lower leg: No edema.     Left lower leg: No edema.  Skin:  General: Skin is warm and dry.     Capillary Refill: Capillary refill takes less than 2 seconds.     Coloration: Skin is not jaundiced or pale.     Findings: No bruising, erythema or lesion.  Neurological:     Mental Status: She is alert and oriented to person, place, and time.     Motor: No weakness.     Coordination: Coordination normal.     Gait: Gait normal.  Psychiatric:        Mood and Affect: Mood normal.        Behavior: Behavior normal.        Thought Content: Thought content normal.        Judgment: Judgment normal.     Results for orders placed or performed in visit on 03/24/23  OB RESULTS CONSOLE GC/Chlamydia  Result Value Ref Range    Chlamydia Negative         Assessment & Plan:   Problem List Items Addressed This Visit       Digestive   Gastroesophageal reflux disease - Primary    Continue Carafate 1 g 3 times daily, pantoprazole 40 mg daily, famotidine 20 mg twice daily as needed Avoid fatty fried food, spicy food, caffeinated drinks, and other food that triggers acid reflux. Patient referred to GI      Relevant Medications   famotidine (PEPCID) 20 MG tablet   Other Relevant Orders   Ambulatory referral to Gastroenterology     Other   Hospital discharge follow-up    Hospital chart reviewed, including discharge summary Medications reconciled and reviewed with the patient in detail        Meds ordered this encounter  Medications   famotidine (PEPCID) 20 MG tablet    Sig: Take 1 tablet (20 mg total) by mouth 2 (two) times daily as needed for heartburn or indigestion.    Dispense:  60 tablet    Refill:  0    No follow-ups on file.  Donell Beers, FNP

## 2023-03-24 NOTE — Assessment & Plan Note (Signed)
Continue Carafate 1 g 3 times daily, pantoprazole 40 mg daily, famotidine 20 mg twice daily as needed Avoid fatty fried food, spicy food, caffeinated drinks, and other food that triggers acid reflux. Patient referred to GI

## 2023-03-24 NOTE — Assessment & Plan Note (Signed)
Hospital chart reviewed, including discharge summary Medications reconciled and reviewed with the patient in detail 

## 2023-03-30 ENCOUNTER — Other Ambulatory Visit (HOSPITAL_COMMUNITY): Payer: Self-pay

## 2023-03-31 ENCOUNTER — Other Ambulatory Visit (HOSPITAL_COMMUNITY): Payer: Self-pay

## 2023-03-31 ENCOUNTER — Other Ambulatory Visit: Payer: Self-pay

## 2023-04-01 ENCOUNTER — Ambulatory Visit: Payer: Medicaid Other | Attending: Student

## 2023-04-01 ENCOUNTER — Other Ambulatory Visit (HOSPITAL_COMMUNITY): Payer: Self-pay

## 2023-04-02 ENCOUNTER — Other Ambulatory Visit: Payer: Self-pay

## 2023-04-02 ENCOUNTER — Other Ambulatory Visit (HOSPITAL_COMMUNITY): Payer: Self-pay

## 2023-04-06 ENCOUNTER — Other Ambulatory Visit (HOSPITAL_COMMUNITY): Payer: Self-pay

## 2023-04-07 ENCOUNTER — Other Ambulatory Visit (HOSPITAL_COMMUNITY): Payer: Self-pay

## 2023-04-07 ENCOUNTER — Ambulatory Visit: Payer: Medicaid Other | Admitting: Gastroenterology

## 2023-04-08 ENCOUNTER — Encounter (INDEPENDENT_AMBULATORY_CARE_PROVIDER_SITE_OTHER): Payer: Medicaid Other | Admitting: Adult Health

## 2023-04-13 ENCOUNTER — Encounter (INDEPENDENT_AMBULATORY_CARE_PROVIDER_SITE_OTHER): Payer: Medicaid Other | Admitting: Adult Health

## 2023-05-04 ENCOUNTER — Other Ambulatory Visit: Payer: Self-pay

## 2023-05-04 ENCOUNTER — Other Ambulatory Visit (HOSPITAL_COMMUNITY): Payer: Self-pay

## 2023-05-04 ENCOUNTER — Other Ambulatory Visit: Payer: Self-pay | Admitting: Nurse Practitioner

## 2023-05-04 MED ORDER — WEGOVY 1.7 MG/0.75ML ~~LOC~~ SOAJ
1.7000 mg | SUBCUTANEOUS | 0 refills | Status: DC
  Filled 2023-05-04: qty 3, 28d supply, fill #0

## 2023-05-07 ENCOUNTER — Other Ambulatory Visit: Payer: Self-pay

## 2023-05-07 ENCOUNTER — Ambulatory Visit
Admission: EM | Admit: 2023-05-07 | Discharge: 2023-05-07 | Disposition: A | Discharge status: Home / Self Care | Payer: Medicaid Other | Attending: Family Medicine | Admitting: Family Medicine

## 2023-05-07 ENCOUNTER — Encounter (HOSPITAL_COMMUNITY): Payer: Self-pay

## 2023-05-07 ENCOUNTER — Emergency Department (HOSPITAL_COMMUNITY)
Admission: EM | Admit: 2023-05-07 | Discharge: 2023-05-08 | Disposition: A | Discharge status: Home / Self Care | Payer: Medicaid Other | Attending: Emergency Medicine | Admitting: Emergency Medicine

## 2023-05-07 DIAGNOSIS — R1084 Generalized abdominal pain: Secondary | ICD-10-CM | POA: Diagnosis present

## 2023-05-07 DIAGNOSIS — R112 Nausea with vomiting, unspecified: Secondary | ICD-10-CM

## 2023-05-07 DIAGNOSIS — R197 Diarrhea, unspecified: Secondary | ICD-10-CM | POA: Insufficient documentation

## 2023-05-07 DIAGNOSIS — R109 Unspecified abdominal pain: Secondary | ICD-10-CM | POA: Diagnosis not present

## 2023-05-07 LAB — URINALYSIS, ROUTINE W REFLEX MICROSCOPIC
Bilirubin Urine: NEGATIVE
Glucose, UA: NEGATIVE mg/dL
Hgb urine dipstick: NEGATIVE
Ketones, ur: 20 mg/dL — AB
Leukocytes,Ua: NEGATIVE
Nitrite: NEGATIVE
Protein, ur: NEGATIVE mg/dL
Specific Gravity, Urine: 1.027 (ref 1.005–1.030)
pH: 5 (ref 5.0–8.0)

## 2023-05-07 LAB — COMPREHENSIVE METABOLIC PANEL
ALT: 17 U/L (ref 0–44)
AST: 15 U/L (ref 15–41)
Albumin: 4 g/dL (ref 3.5–5.0)
Alkaline Phosphatase: 43 U/L (ref 38–126)
Anion gap: 7 (ref 5–15)
BUN: 10 mg/dL (ref 6–20)
CO2: 18 mmol/L — ABNORMAL LOW (ref 22–32)
Calcium: 8.9 mg/dL (ref 8.9–10.3)
Chloride: 110 mmol/L (ref 98–111)
Creatinine, Ser: 0.81 mg/dL (ref 0.44–1.00)
GFR, Estimated: >60 mL/min (ref >60)
Glucose, Bld: 89 mg/dL (ref 70–99)
Potassium: 3.3 mmol/L — ABNORMAL LOW (ref 3.5–5.1)
Sodium: 135 mmol/L (ref 135–145)
Total Bilirubin: 0.7 mg/dL (ref <1.2)
Total Protein: 7.5 g/dL (ref 6.5–8.1)

## 2023-05-07 LAB — LIPASE, BLOOD: Lipase: 28 U/L (ref 11–51)

## 2023-05-07 LAB — POCT URINALYSIS DIP (MANUAL ENTRY)
Blood, UA: NEGATIVE
Glucose, UA: NEGATIVE mg/dL
Leukocytes, UA: NEGATIVE
Nitrite, UA: NEGATIVE
Spec Grav, UA: >=1.03 — AB
Urobilinogen, UA: 0.2 U/dL
pH, UA: 5.5

## 2023-05-07 LAB — CBC
HCT: 38.5 % (ref 36.0–46.0)
Hemoglobin: 13.3 g/dL (ref 12.0–15.0)
MCH: 30 pg (ref 26.0–34.0)
MCHC: 34.5 g/dL (ref 30.0–36.0)
MCV: 86.9 fL (ref 80.0–100.0)
Platelets: 366 10*3/uL (ref 150–400)
RBC: 4.43 MIL/uL (ref 3.87–5.11)
RDW: 12.9 % (ref 11.5–15.5)
WBC: 9 10*3/uL (ref 4.0–10.5)
nRBC: 0 % (ref 0.0–0.2)

## 2023-05-07 LAB — POCT URINE PREGNANCY: Preg Test, Ur: NEGATIVE

## 2023-05-07 LAB — HCG, SERUM, QUALITATIVE: Preg, Serum: NEGATIVE

## 2023-05-07 MED ORDER — ONDANSETRON HCL 4 MG/2ML IJ SOLN
4.0000 mg | Freq: Once | INTRAMUSCULAR | Status: AC
  Administered 2023-05-07: 4 mg via INTRAMUSCULAR

## 2023-05-07 NOTE — ED Triage Notes (Addendum)
Pt c/o n/v/d started last night-abd pain-tearful/spitting in emesis bag-NAD-steady gait

## 2023-05-07 NOTE — Discharge Instructions (Signed)
Please head to the emergency room now as you are in need of a higher level of care than we can provide in the urgent care setting. This includes testing and intervention for an acute abdomen. Please head straight to the emergency room.
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
## 2023-05-07 NOTE — ED Triage Notes (Signed)
Pt reports ongoing intermittent, generalized abd pain. Pt reports flare up since last night. Pt endorses emesis as well.

## 2023-05-07 NOTE — ED Notes (Signed)
Patient is being discharged from the Urgent Care and sent to the Emergency Department via POV . Per [place]. PA-C, patient is in need of higher level of care due to abd pain, n/v/d. Patient is aware and verbalizes understanding of plan of care.  Vitals:   05/07/23 1846  BP: 136/85  Pulse: 99  Resp: 20  Temp: 97.8 F (36.6 C)  SpO2: 98%

## 2023-05-08 MED ORDER — FENTANYL CITRATE PF 50 MCG/ML IJ SOSY
50.0000 ug | PREFILLED_SYRINGE | Freq: Once | INTRAMUSCULAR | Status: AC
  Administered 2023-05-08: 50 ug via INTRAVENOUS
  Filled 2023-05-08: qty 1

## 2023-05-08 MED ORDER — PANTOPRAZOLE SODIUM 40 MG IV SOLR
40.0000 mg | Freq: Once | INTRAVENOUS | Status: AC
  Administered 2023-05-08: 40 mg via INTRAVENOUS
  Filled 2023-05-08: qty 10

## 2023-05-08 MED ORDER — ONDANSETRON 4 MG PO TBDP: 4.0000 mg | ORAL_TABLET | Freq: Three times a day (TID) | ORAL | 0 refills | Status: DC | PRN

## 2023-05-08 MED ORDER — LACTATED RINGERS IV BOLUS
1000.0000 mL | Freq: Once | INTRAVENOUS | Status: AC
  Administered 2023-05-08: 1000 mL via INTRAVENOUS

## 2023-05-08 MED ORDER — METOCLOPRAMIDE HCL 5 MG/ML IJ SOLN
10.0000 mg | Freq: Once | INTRAMUSCULAR | Status: AC
  Administered 2023-05-08: 10 mg via INTRAVENOUS
  Filled 2023-05-08: qty 2

## 2023-05-08 NOTE — ED Notes (Signed)
Pt given a sandwich and juice for PO challenge. Pt passed.

## 2023-05-08 NOTE — ED Provider Notes (Signed)
[place] EM[facility]ENC[facility] DEPART[facility] AT [facility] Provider Note   CSN: [id number] Arrival date & time: 05/07/23  1947     History  Chief Complaint  Patient presents with   Abdominal Pain    Natasha Sampson is a 17 y.o. female.  The history is provided by the patient and medical records.  Abdominal Pain Natasha Sampson is a 17 y.o. female who presents to the Emergency Department complaining of abdominal pain vomiting and diarrhea.  Symptoms started on December 25 with a generalized abdominal pain with numerous episodes of emesis and diarrhea.  Diarrhea is described as watery and green.  No dysuria, fever.  She states she is unable to tolerate anything by mouth.  She feels like this is a flareup of her GERD.  No known sick contacts or bad food exposures.  She has a history of idiopathic intracranial hypertension.  She does use Wegovy but her last dose was 2 weeks ago.  She has experienced similar episodes in the past.  She was seen at urgent care earlier today and received IM ondansetron without improvement in her symptoms.      Home Medications Prior to Admission medications   Medication Sig Start Date End Date Taking? Authorizing Provider  ondansetron (ZOFRAN-ODT) 4 MG disintegrating tablet Take 1 tablet (4 mg total) by mouth every 8 (eight) hours as needed. 05/08/23  Yes Tilden Fossa, MD  acetaZOLAMIDE ER (DIAMOX) 500 MG capsule Take 1 capsule (500 mg total) by mouth 2 (two) times daily. 12/17/22 12/12/23  Windell Norfolk, MD  albuterol (VENTOLIN HFA) 108 (90 Base) MCG/ACT inhaler Inhale 1-2 puffs into the lungs every 6 (six) hours as needed for wheezing or shortness of breath. 03/05/23   Radford Pax, NP  famotidine (PEPCID) 20 MG tablet Take 1 tablet (20 mg total) by mouth 2 (two) times daily as needed for heartburn or indigestion. 03/24/23   Paseda, Baird Kay, FNP  pantoprazole (PROTONIX) 40 MG tablet Take 1 tablet (40 mg total) by mouth daily. 03/24/23   Donell Beers, FNP  Semaglutide-Weight Management (WEGOVY) 1.7 MG/0.75ML SOAJ Inject 1.7 mg into the skin once a week. 05/04/23   Paseda, Baird Kay, FNP  sucralfate (CARAFATE) 1 g tablet Take 1 tablet (1 g total) by mouth 3 (three) times daily. Take with meals 03/16/23 04/15/23  Terald Sleeper, MD  SUMAtriptan (IMITREX) 100 MG tablet Take 1 tablet (100 mg total) by mouth every 2 (two) hours as needed for migraine. May repeat in 2 hours if headache persists or recurs. 12/17/22   Windell Norfolk, MD  topiramate (TOPAMAX) 100 MG tablet Take 1 tablet (100 mg total) by mouth 2 (two) times daily. 12/17/22 09/13/23  Windell Norfolk, MD  TYLENOL 500 MG tablet Take 500-1,000 mg by mouth every 6 (six) hours as needed for mild pain or headache.    [provider]      Allergies    Gadavist [gadobutrol]    Review of Systems   Review of Systems  Gastrointestinal:  Positive for abdominal pain.  All other systems reviewed and are negative.   Physical Exam Updated Vital Signs BP 129/83   Pulse 74   Temp 98 F (36.7 C) (Oral)   Resp 18   Ht 5\' 4"  (1.626 m)   LMP 04/21/2023 (Approximate)   SpO2 100%   BMI 41.92 kg/m  Physical Exam Vitals and nursing note reviewed.  Constitutional:      Appearance: She is well-developed.  HENT:  Head: Normocephalic and atraumatic.  Cardiovascular:     Rate and Rhythm: Normal rate and regular rhythm.  Pulmonary:     Effort: Pulmonary effort is normal. No respiratory distress.  Abdominal:     Palpations: Abdomen is soft.     Tenderness: There is no guarding or rebound.     Comments: Mild generalized abdominal tenderness  Musculoskeletal:        General: No tenderness.  Skin:    General: Skin is warm and dry.  Neurological:     Mental Status: She is alert and oriented to person, place, and time.  Psychiatric:        Behavior: Behavior normal.     ED Results / Procedures / Treatments   Labs (all labs ordered are listed, but only abnormal results  are displayed) Labs Reviewed  COMPREHENSIVE METABOLIC PANEL - Abnormal; Notable for the following components:      Result Value   Potassium 3.3 (*)    CO2 18 (*)    All other components within normal limits  URINALYSIS, ROUTINE W REFLEX MICROSCOPIC - Abnormal; Notable for the following components:   Ketones, ur 20 (*)    All other components within normal limits  LIPASE, BLOOD  CBC  HCG, SERUM, QUALITATIVE    EKG None  Radiology No results found.  Procedures Procedures    Medications Ordered in ED Medications  lactated ringers bolus 1,000 mL (1,000 mLs Intravenous New Bag/Given 05/08/23 0413)  metoCLOPramide (REGLAN) injection 10 mg (10 mg Intravenous Given 05/08/23 0407)  pantoprazole (PROTONIX) injection 40 mg (40 mg Intravenous Given 05/08/23 0409)  fentaNYL (SUBLIMAZE) injection 50 mcg (50 mcg Intravenous Given 05/08/23 0356)    ED Course/ Medical Decision Making/ A&P                                 Medical Decision Making Amount and/or Complexity of Data Reviewed Labs: ordered.  Risk Prescription drug management.   Patient with hx of idiopathic intracranial hypertension, reflux here for evaluation of vomiting and diarrhea with abdominal pain.  She has minimal tenderness on examination without peritoneal findings.  She has mild hypokalemia, mildly decreased bicarb with small ketones in her urine.  She was treated with IV fluid, antiemetic and pain control.  On repeat assessment she is feeling significantly improved and able to tolerate p.o.  Current clinical picture is not consistent with pancreatitis, SBO, perforated viscus, cholecystitis, appendicitis.  Discussed with patient home care for vomiting and diarrhea.  Discussed oral fluid hydration with outpatient follow-up and return precautions.        Final Clinical Impression(s) / ED Diagnoses Final diagnoses:  Nausea vomiting and diarrhea  Generalized abdominal pain    Rx / DC Orders ED Discharge  Orders          Ordered    ondansetron (ZOFRAN-ODT) 4 MG disintegrating tablet  Every 8 hours PRN        05/08/23 0424              Tilden Fossa, MD 05/08/23 [phone]

## 2023-05-09 ENCOUNTER — Other Ambulatory Visit (HOSPITAL_COMMUNITY): Payer: Self-pay

## 2023-05-26 ENCOUNTER — Telehealth: Payer: Self-pay

## 2023-05-26 ENCOUNTER — Ambulatory Visit: Payer: Self-pay | Admitting: Nurse Practitioner

## 2023-05-26 NOTE — Telephone Encounter (Signed)
 See note

## 2023-05-28 ENCOUNTER — Encounter (INDEPENDENT_AMBULATORY_CARE_PROVIDER_SITE_OTHER): Payer: Medicaid Other | Admitting: Physician Assistant

## 2023-06-02 ENCOUNTER — Ambulatory Visit: Payer: Medicaid Other | Admitting: Family Medicine

## 2023-06-02 ENCOUNTER — Encounter: Payer: Self-pay | Admitting: Gastroenterology

## 2023-06-10 ENCOUNTER — Other Ambulatory Visit (HOSPITAL_COMMUNITY): Payer: Self-pay

## 2023-06-10 ENCOUNTER — Encounter: Payer: Self-pay | Admitting: Nurse Practitioner

## 2023-06-10 ENCOUNTER — Ambulatory Visit (INDEPENDENT_AMBULATORY_CARE_PROVIDER_SITE_OTHER): Payer: Medicaid Other | Admitting: Nurse Practitioner

## 2023-06-10 DIAGNOSIS — G932 Benign intracranial hypertension: Secondary | ICD-10-CM

## 2023-06-10 DIAGNOSIS — G43009 Migraine without aura, not intractable, without status migrainosus: Secondary | ICD-10-CM

## 2023-06-10 DIAGNOSIS — K219 Gastro-esophageal reflux disease without esophagitis: Secondary | ICD-10-CM

## 2023-06-10 DIAGNOSIS — F332 Major depressive disorder, recurrent severe without psychotic features: Secondary | ICD-10-CM

## 2023-06-10 DIAGNOSIS — E876 Hypokalemia: Secondary | ICD-10-CM | POA: Diagnosis not present

## 2023-06-10 MED ORDER — WEGOVY 1 MG/0.5ML ~~LOC~~ SOAJ: 1.0000 mg | SUBCUTANEOUS | 3 refills | Status: DC

## 2023-06-10 MED ORDER — WEGOVY 1 MG/0.5ML ~~LOC~~ SOAJ
1.0000 mg | SUBCUTANEOUS | 3 refills | Status: DC
  Filled 2023-06-10: qty 2, 28d supply, fill #0

## 2023-06-10 MED ORDER — ONDANSETRON 4 MG PO TBDP: 4.0000 mg | ORAL_TABLET | Freq: Three times a day (TID) | ORAL | 0 refills | Status: AC | PRN

## 2023-06-10 MED ORDER — FAMOTIDINE 20 MG PO TABS: 20.0000 mg | ORAL_TABLET | Freq: Two times a day (BID) | ORAL | 0 refills | Status: DC | PRN

## 2023-06-10 NOTE — Patient Instructions (Signed)
1. Gastroesophageal reflux disease, unspecified whether esophagitis present  - ondansetron (ZOFRAN-ODT) 4 MG disintegrating tablet; Take 1 tablet (4 mg total) by mouth every 8 (eight) hours as needed.  Dispense: 12 tablet; Refill: 0 - famotidine (PEPCID) 20 MG tablet; Take 1 tablet (20 mg total) by mouth 2 (two) times daily as needed for heartburn or indigestion.  Dispense: 60 tablet; Refill: 0  2. Hypokalemia (Primary)  - Potassium  3. Severe episode of recurrent major depressive disorder, without psychotic features (HCC)   4. Severe obesity (BMI >= 40) (HCC)  - Semaglutide-Weight Management (WEGOVY) 1 MG/0.5ML SOAJ; Inject 1 mg into the skin once a week.  Dispense: 2 mL; Refill: 3   It is important that you exercise regularly at least 30 minutes 5 times a week as tolerated  Think about what you will eat, plan ahead. Choose " clean, green, fresh or frozen" over canned, processed or packaged foods which are more sugary, salty and fatty. 70 to 75% of food eaten should be vegetables and fruit. Three meals at set times with snacks allowed between meals, but they must be fruit or vegetables. Aim to eat over a 12 hour period , example 7 am to 7 pm, and STOP after  your last meal of the day. Drink water,generally about 64 ounces per day, no other drink is as healthy. Fruit juice is best enjoyed in a healthy way, by EATING the fruit.  Thanks for choosing [facility] we consider it a privelige to serve you.

## 2023-06-10 NOTE — Assessment & Plan Note (Signed)
Wt Readings from Last 3 Encounters:  06/10/23 235 lb (106.6 kg) (>99%, Z= 2.37)*  03/24/23 244 lb 3.2 oz (110.8 kg) (>99%, Z= 2.45)*  03/16/23 255 lb (115.7 kg) (>99%, Z= 2.54)*   * Growth percentiles are based on CDC (Girls, 2-20 Years) data.   Body mass index is 40.34 kg/m.  Patient has lost about 20 pounds since she started [facility] We will again try Wegovy 1 mg dose since she tolerated that dosage well Patient encouraged to avoid fatty fried foods eat smaller portion of meal while on the medication to help prevent nausea Encouraged to call the office if she experiences any side effects from this dose. Encouraged to engage in regular moderate exercises at least 150 minutes weekly as tolerated Patient counseled on low-carb diet Follow-up in 3 months

## 2023-06-10 NOTE — Assessment & Plan Note (Signed)
Continue  Imitrex 100 mg every 2 hours as needed, Topamax 100 mg twice daily Patient encouraged to keep upcoming appointment with neurology

## 2023-06-10 NOTE — Assessment & Plan Note (Signed)
    06/10/2023    1:57 PM 03/24/2023    3:00 PM 02/24/2023    8:08 AM  Depression screen PHQ 2/9  Decreased Interest 1 0 0  Down, Depressed, Hopeless 1 0 0  PHQ - 2 Score 2 0 0  Tired, decreased energy 1    Change in appetite 1    Feeling bad or failure about yourself  0    Trouble concentrating 1    Moving slowly or fidgety/restless 1    Suicidal thoughts 0    Difficult doing work/chores Somewhat difficult    Not interested in medications List of therapist provided She was encouraged to follow-up with them for counseling

## 2023-06-10 NOTE — Assessment & Plan Note (Signed)
Famotidine 20 mg twice daily refilled Patient encouraged to avoid foods that triggers acid reflux symptoms Follow-up with GI as planned

## 2023-06-10 NOTE — Assessment & Plan Note (Signed)
Lab Results  Component Value Date   NA 135 05/07/2023   K 3.3 (L) 05/07/2023   CO2 18 (L) 05/07/2023   GLUCOSE 89 05/07/2023   BUN 10 05/07/2023   CREATININE 0.81 05/07/2023   CALCIUM 8.9 05/07/2023   EGFR 118 01/18/2023   GFRNONAA >60 05/07/2023  Rechecking potassium level today

## 2023-06-10 NOTE — Assessment & Plan Note (Signed)
Continue Diamox 500 mg twice daily, Encouraged to keep upcoming appointment with neurology
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
## 2023-06-11 LAB — POTASSIUM: Potassium: 3.9 mmol/L (ref 3.5–5.2)

## 2023-06-23 ENCOUNTER — Encounter (INDEPENDENT_AMBULATORY_CARE_PROVIDER_SITE_OTHER): Payer: Medicaid Other | Admitting: Internal Medicine

## 2023-07-01 ENCOUNTER — Ambulatory Visit: Payer: Medicaid Other | Admitting: Neurology

## 2023-07-02 ENCOUNTER — Other Ambulatory Visit: Payer: Self-pay

## 2023-07-07 ENCOUNTER — Ambulatory Visit: Payer: Medicaid Other | Admitting: Gastroenterology

## 2023-07-07 ENCOUNTER — Encounter: Payer: Self-pay | Admitting: Gastroenterology

## 2023-07-07 VITALS — BP 110/72 | HR 88 | Ht 64.0 in | Wt 227.0 lb

## 2023-07-07 DIAGNOSIS — R142 Eructation: Secondary | ICD-10-CM | POA: Insufficient documentation

## 2023-07-07 DIAGNOSIS — R0789 Other chest pain: Secondary | ICD-10-CM | POA: Insufficient documentation

## 2023-07-07 DIAGNOSIS — K5900 Constipation, unspecified: Secondary | ICD-10-CM | POA: Insufficient documentation

## 2023-07-07 DIAGNOSIS — R1013 Epigastric pain: Secondary | ICD-10-CM | POA: Diagnosis not present

## 2023-07-07 DIAGNOSIS — K219 Gastro-esophageal reflux disease without esophagitis: Secondary | ICD-10-CM | POA: Diagnosis not present

## 2023-07-07 DIAGNOSIS — R112 Nausea with vomiting, unspecified: Secondary | ICD-10-CM | POA: Diagnosis not present

## 2023-07-07 MED ORDER — PANTOPRAZOLE SODIUM 40 MG PO TBEC: 40.0000 mg | DELAYED_RELEASE_TABLET | Freq: Two times a day (BID) | ORAL | 2 refills | Status: DC
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
## 2023-07-07 NOTE — Patient Instructions (Addendum)
 We have sent the following medications to your pharmacy for you to pick up at your convenience: Pantoprazole 40 mg twice daily 30-60 minutes before breakfast and dinner.   Stop Wegovy.   Start Miralax 1 capful daily in 8 ounces of liquid.  You have been scheduled for an endoscopy. Please follow written instructions given to you at your visit today.  If you use inhalers (even only as needed), please bring them with you on the day of your procedure.  If you take any of the following medications, they will need to be adjusted prior to your procedure:   DO NOT TAKE 7 DAYS PRIOR TO TEST- Trulicity (dulaglutide) Ozempic, Wegovy (semaglutide) Mounjaro (tirzepatide) Bydureon Bcise (exanatide extended release)  DO NOT TAKE 1 DAY PRIOR TO YOUR TEST Rybelsus (semaglutide) Adlyxin (lixisenatide) Victoza (liraglutide) Byetta (exanatide) _______________________________________________________  If your blood pressure at your visit was 140/90 or greater, please contact your primary care physician to follow up on this.  _______________________________________________________  If you are age 22 or older, your body mass index should be between 23-30. Your Body mass index is 38.96 kg/m. If this is out of the aforementioned range listed, please consider follow up with your Primary Care Provider.  If you are age 61 or younger, your body mass index should be between 19-25. Your Body mass index is 38.96 kg/m. If this is out of the aformentioned range listed, please consider follow up with your Primary Care Provider.   ________________________________________________________  The [facility] providers would like to encourage you to use [facility] to communicate with providers for non-urgent requests or questions.  Due to long hold times on the telephone, sending your provider a message by [facility] may be a faster and more efficient way to get a response.  Please allow 48 business hours for a response.   Please remember that this is for non-urgent requests.  _______________________________________________________

## 2023-07-14 ENCOUNTER — Encounter: Payer: Medicaid Other | Admitting: Internal Medicine

## 2023-07-14 ENCOUNTER — Telehealth: Payer: Self-pay | Admitting: Internal Medicine

## 2023-07-14 NOTE — Telephone Encounter (Signed)
 Patient called back to reschedule to 07/20/2023 at 2:00 PM for EGD.

## 2023-07-14 NOTE — Telephone Encounter (Signed)
 Good afternoon   Called pt to confirm 3 pm EGD, pt stated that she would need to reschedule.

## 2023-07-16 ENCOUNTER — Encounter (INDEPENDENT_AMBULATORY_CARE_PROVIDER_SITE_OTHER): Payer: Self-pay

## 2023-07-20 ENCOUNTER — Ambulatory Visit (AMBULATORY_SURGERY_CENTER): Admitting: Internal Medicine

## 2023-07-20 ENCOUNTER — Encounter: Payer: Self-pay | Admitting: Internal Medicine

## 2023-07-20 VITALS — BP 116/69 | HR 91 | Temp 97.7°F | Resp 19 | Ht 64.0 in | Wt 227.0 lb

## 2023-07-20 DIAGNOSIS — R142 Eructation: Secondary | ICD-10-CM

## 2023-07-20 DIAGNOSIS — T182XXA Foreign body in stomach, initial encounter: Secondary | ICD-10-CM | POA: Diagnosis not present

## 2023-07-20 DIAGNOSIS — R1013 Epigastric pain: Secondary | ICD-10-CM

## 2023-07-20 DIAGNOSIS — R112 Nausea with vomiting, unspecified: Secondary | ICD-10-CM

## 2023-07-20 DIAGNOSIS — K219 Gastro-esophageal reflux disease without esophagitis: Secondary | ICD-10-CM

## 2023-07-20 MED ORDER — SODIUM CHLORIDE 0.9 % IV SOLN: 500.0000 mL | Freq: Once | INTRAVENOUS | Status: AC

## 2023-07-20 NOTE — Patient Instructions (Addendum)
 Please read handouts provided. Continue present medications. Resume previous diet.   YOU HAD AN ENDOSCOPIC PROCEDURE TODAY AT THE [facility]:   Refer to the procedure report that was given to you for any specific questions about what was found during the examination.  If the procedure report does not answer your questions, please call your gastroenterologist to clarify.  If you requested that your care partner not be given the details of your procedure findings, then the procedure report has been included in a sealed envelope for you to review at your convenience later.  YOU SHOULD EXPECT: Some feelings of bloating in the abdomen. Passage of more gas than usual.  Walking can help get rid of the air that was put into your GI tract during the procedure and reduce the bloating. If you had a lower endoscopy (such as a colonoscopy or flexible sigmoidoscopy) you may notice spotting of blood in your stool or on the toilet paper. If you underwent a bowel prep for your procedure, you may not have a normal bowel movement for a few days.  Please Note:  You might notice some irritation and congestion in your nose or some drainage.  This is from the oxygen used during your procedure.  There is no need for concern and it should clear up in a day or so.  SYMPTOMS TO REPORT IMMEDIATELY:  Following upper endoscopy (EGD)  Vomiting of blood or coffee ground material  New chest pain or pain under the shoulder blades  Painful or persistently difficult swallowing  New shortness of breath  Fever of 100F or higher  Black, tarry-looking stools  For urgent or emergent issues, a gastroenterologist can be reached at any hour by calling [phone]. Do not use MyChart messaging for urgent concerns.    DIET:  We do recommend a small meal at first, but then you may proceed to your regular diet.  Drink plenty of fluids but you should avoid alcoholic beverages for 24 hours.  ACTIVITY:  You should plan to  take it easy for the rest of today and you should NOT DRIVE or use heavy machinery until tomorrow (because of the sedation medicines used during the test).    FOLLOW UP: Our staff will call the number listed on your records the next business day following your procedure.  We will call around 7:15- 8:00 am to check on you and address any questions or concerns that you may have regarding the information given to you following your procedure. If we do not reach you, we will leave a message.     If any biopsies were taken you will be contacted by phone or by letter within the next 1-3 weeks.  Please call us at [phone] if you have not heard about the biopsies in 3 weeks.    SIGNATURES/CONFIDENTIALITY: You and/or your care partner have signed paperwork which will be entered into your electronic medical record.  These signatures attest to the fact that that the information above on your After Visit Summary has been reviewed and is understood.  Full responsibility of the confidentiality of this discharge information lies with you and/or your care-partner.There was some food in the stomach - no abnormal mucosa (lining) and no blockage.  I think it is most likely that the [facility] is causing your gastrointestinal symptoms.  I recommend stopping that for 3 months (at least) to see if that is causing your problems.  If, after 3 months your are not better, please let  us know.  I appreciate the opportunity to care for you. Iva Boop, MD, [facility]

## 2023-07-20 NOTE — Progress Notes (Signed)
 Pt's states no medical or surgical changes since previsit or office visit.

## 2023-07-20 NOTE — Progress Notes (Signed)
 Pt sedate, gd SR's, VSS, report to RN

## 2023-07-20 NOTE — Progress Notes (Signed)
 History and Physical Interval Note:  07/20/2023 2:03 PM  Natasha Sampson  has presented today for endoscopic procedure(s), with the diagnosis of  Encounter Diagnoses  Name Primary?   Nausea and vomiting, unspecified vomiting type Yes   Abdominal pain, epigastric    Gastroesophageal reflux disease, unspecified whether esophagitis present    Belching   .  The various methods of evaluation and treatment have been discussed with the patient and/or family. After consideration of risks, benefits and other options for treatment, the patient has consented to  the endoscopic procedure(s).   The patient's history has been reviewed, patient examined, no change in status, stable for endoscopic procedure(s).  I have reviewed the patient's chart and labs.  Questions were answered to the patient's satisfaction.     Iva Boop, MD, [facility]

## 2023-07-20 NOTE — Op Note (Signed)
 [facility] Patient Name: Natasha Sampson Procedure Date: 07/20/2023 1:54 PM MRN: [id number] Endoscopist: Iva Boop , MD, [id number] Age: 18 Referring MD:  Date of Birth: May 15, 2005 Gender: Female Account #: [id number] Procedure:                Upper GI endoscopy Indications:              Epigastric abdominal pain, Esophageal reflux                            symptoms that persist despite appropriate therapy,                            Nausea with vomiting Medicines:                Monitored Anesthesia Care Procedure:                Pre-Anesthesia Assessment:                           - Prior to the procedure, a History and Physical                            was performed, and patient medications and                            allergies were reviewed. The patient's tolerance of                            previous anesthesia was also reviewed. The risks                            and benefits of the procedure and the sedation                            options and risks were discussed with the patient.                            All questions were answered, and informed consent                            was obtained. Prior Anticoagulants: The patient has                            taken no anticoagulant or antiplatelet agents. ASA                            Grade Assessment: III - A patient with severe                            systemic disease. After reviewing the risks and                            benefits, the patient was deemed in satisfactory  condition to undergo the procedure.                           After obtaining informed consent, the endoscope was                            passed under direct vision. Throughout the                            procedure, the patient's blood pressure, pulse, and                            oxygen saturations were monitored continuously. The                            Olympus Scope [id number] was  introduced through the                            mouth, and advanced to the second part of duodenum.                            The upper GI endoscopy was accomplished without                            difficulty. The patient tolerated the procedure                            well. Scope In: Scope Out: Findings:                 The examined esophagus was normal.                           A medium amount of food (residue) was found in the                            gastric body.                           The examined duodenum was normal.                           The cardia and gastric fundus were otherwise normal                            on retroflexion. Complications:            No immediate complications. Estimated Blood Loss:     Estimated blood loss: none. Impression:               - Normal esophagus.                           - A medium amount of food (residue) in the stomach.                           - Normal examined duodenum.                           -  No specimens collected. I believe problems are                            from side effects of Wegovy. Recommendation:           - Patient has a contact number available for                            emergencies. The signs and symptoms of potential                            delayed complications were discussed with the                            patient. Return to normal activities tomorrow.                            Written discharge instructions were provided to the                            patient.                           - Resume previous diet.                           - Continue present medications.                           - Do not take Wegovy for at least 3 months total.                            If still having regurgitation/vomiting, abdoinal                            pain and belching then would check 4 hour gastric                            emptying study Iva Boop, MD 07/20/2023 2:26:52  PM This report has been signed electronically.

## 2023-07-21 ENCOUNTER — Telehealth: Payer: Self-pay | Admitting: *Deleted

## 2023-07-21 NOTE — Telephone Encounter (Signed)
  Follow up Call-     07/20/2023    1:23 PM  Call back number  Post procedure Call Back phone  # [phone]  Permission to leave phone message Yes     Patient questions:  Do you have a fever, pain , or abdominal swelling? No. Pain Score  0 *  Have you tolerated food without any problems? yes  Have you been able to return to your normal activities? Yes.    Do you have any questions about your discharge instructions: Diet   No. Medications  No. Follow up visit  No.  Do you have questions or concerns about your Care? No.  Actions: * If pain score is 4 or above: No action needed, pain <4.

## 2023-07-23 ENCOUNTER — Other Ambulatory Visit: Payer: Self-pay

## 2023-07-23 DIAGNOSIS — K219 Gastro-esophageal reflux disease without esophagitis: Secondary | ICD-10-CM

## 2023-07-23 MED ORDER — FAMOTIDINE 20 MG PO TABS: 20.0000 mg | ORAL_TABLET | Freq: Two times a day (BID) | ORAL | 0 refills | Status: DC | PRN

## 2023-08-20 ENCOUNTER — Other Ambulatory Visit: Payer: Self-pay | Admitting: Nurse Practitioner

## 2023-08-20 DIAGNOSIS — K219 Gastro-esophageal reflux disease without esophagitis: Secondary | ICD-10-CM

## 2023-09-06 ENCOUNTER — Encounter (HOSPITAL_COMMUNITY): Payer: Self-pay | Admitting: *Deleted

## 2023-09-06 ENCOUNTER — Ambulatory Visit (INDEPENDENT_AMBULATORY_CARE_PROVIDER_SITE_OTHER)

## 2023-09-06 ENCOUNTER — Other Ambulatory Visit: Payer: Self-pay

## 2023-09-06 ENCOUNTER — Ambulatory Visit (HOSPITAL_COMMUNITY)
Admission: EM | Admit: 2023-09-06 | Discharge: 2023-09-06 | Disposition: A | Discharge status: Home / Self Care | Attending: Emergency Medicine | Admitting: Emergency Medicine

## 2023-09-06 DIAGNOSIS — R0789 Other chest pain: Secondary | ICD-10-CM

## 2023-09-06 MED ORDER — METHOCARBAMOL 500 MG PO TABS: 500.0000 mg | ORAL_TABLET | Freq: Four times a day (QID) | ORAL | 0 refills | Status: AC

## 2023-09-06 MED ORDER — NAPROXEN 500 MG PO TABS
500.0000 mg | ORAL_TABLET | Freq: Two times a day (BID) | ORAL | 2 refills | Status: AC
Start: 2023-09-06 — End: 2024-09-05
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
## 2023-09-06 NOTE — Discharge Instructions (Signed)
 Return if any problems.

## 2023-09-06 NOTE — ED Triage Notes (Signed)
 PT reports Lt CP sharp like a needle to her back. Pt reports the pain makes it hard to move.

## 2023-09-14 ENCOUNTER — Ambulatory Visit (HOSPITAL_COMMUNITY)
Admission: EM | Admit: 2023-09-14 | Discharge: 2023-09-14 | Disposition: A | Discharge status: Home / Self Care | Attending: Emergency Medicine | Admitting: Emergency Medicine

## 2023-09-14 ENCOUNTER — Encounter (HOSPITAL_COMMUNITY): Payer: Self-pay | Admitting: Emergency Medicine

## 2023-09-14 DIAGNOSIS — B349 Viral infection, unspecified: Secondary | ICD-10-CM

## 2023-09-14 DIAGNOSIS — R051 Acute cough: Secondary | ICD-10-CM

## 2023-09-14 MED ORDER — AZELASTINE HCL 0.1 % NA SOLN: 2.0000 | Freq: Two times a day (BID) | NASAL | 0 refills | Status: AC

## 2023-09-14 MED ORDER — PROMETHAZINE-DM 6.25-15 MG/5ML PO SYRP
5.0000 mL | ORAL_SOLUTION | Freq: Every evening | ORAL | 0 refills | Status: DC | PRN
Start: 2023-09-14 — End: 2023-10-19

## 2023-09-14 MED ORDER — BENZONATATE 100 MG PO CAPS: 100.0000 mg | ORAL_CAPSULE | Freq: Three times a day (TID) | ORAL | 0 refills | Status: DC
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
## 2023-09-14 NOTE — ED Triage Notes (Signed)
 Pt reports having sinus congestion,  coughing, sneezing, abd pain, body aches, abd pains, SOB since Thursday last week. Took Tylenol .

## 2023-09-14 NOTE — Discharge Instructions (Addendum)
 Use azelastine spray twice daily to help with congestion. Take Tessalon  every 8 hours as needed for cough. Take Promethazine  DM cough syrup at bedtime as needed for cough.  This can make you drowsy so do not drive, work, or drink alcohol while taking this. Otherwise you can take over-the-counter Mucinex  as needed for cough and congestion. Alternate between 650 mg of Tylenol  and 400 mg ibuprofen  every 6-8 hours as needed for headache, sore throat, and bodyaches. If you develop any diarrhea you can take over-the-counter Imodium as needed. Make sure you are staying hydrated and getting plenty of rest. Return here if your symptoms persist or worsen.

## 2023-09-16 ENCOUNTER — Ambulatory Visit: Payer: Self-pay | Admitting: Nurse Practitioner

## 2023-09-19 ENCOUNTER — Other Ambulatory Visit: Payer: Self-pay | Admitting: Nurse Practitioner

## 2023-09-19 DIAGNOSIS — K219 Gastro-esophageal reflux disease without esophagitis: Secondary | ICD-10-CM

## 2023-10-19 ENCOUNTER — Ambulatory Visit (INDEPENDENT_AMBULATORY_CARE_PROVIDER_SITE_OTHER): Admitting: Nurse Practitioner

## 2023-10-19 ENCOUNTER — Encounter: Payer: Self-pay | Admitting: Nurse Practitioner

## 2023-10-19 ENCOUNTER — Encounter: Payer: Self-pay | Admitting: Neurology

## 2023-10-19 DIAGNOSIS — H53143 Visual discomfort, bilateral: Secondary | ICD-10-CM | POA: Diagnosis not present

## 2023-10-19 DIAGNOSIS — G43009 Migraine without aura, not intractable, without status migrainosus: Secondary | ICD-10-CM | POA: Diagnosis not present

## 2023-10-19 DIAGNOSIS — G932 Benign intracranial hypertension: Secondary | ICD-10-CM | POA: Diagnosis not present

## 2023-10-19 DIAGNOSIS — K219 Gastro-esophageal reflux disease without esophagitis: Secondary | ICD-10-CM

## 2023-10-19 MED ORDER — AMITRIPTYLINE HCL 25 MG PO TABS: 25.0000 mg | ORAL_TABLET | Freq: Every day | ORAL | 0 refills | Status: DC

## 2023-10-19 NOTE — Assessment & Plan Note (Addendum)
 Continue Imitrex  100 mg as needed Since she does not get to see neurologist until August we will Start amitriptyline 25 mg at bedtime Follow-up in 4 weeks

## 2023-10-19 NOTE — Patient Instructions (Signed)

## 2023-10-19 NOTE — Assessment & Plan Note (Signed)
 Continue pantoprazole  40 mg twice daily Followed by GI

## 2023-10-19 NOTE — Assessment & Plan Note (Addendum)
 Follow-up with ophthalmologist may need a prescription glasses

## 2023-10-19 NOTE — Assessment & Plan Note (Signed)
 She has stopped taking Diamox  500 mg tablet Patient encouraged to follow-up with neurology
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
## 2023-10-19 NOTE — Assessment & Plan Note (Addendum)
 Does a lot of Walking, cooks more at home not eating out , no snacking  Started a weight loss program recently  She has gained weight but she feels much better after stopping Ozempic  due to its side effects  Patient counseled on low-carb diet Encouraged to engage in regular moderate to vigorous exercises at least 150 minutes weekly as tolerated  Wt Readings from Last 3 Encounters:  10/19/23 240 lb (108.9 kg)  07/20/23 227 lb (103 kg) (99%, Z= 2.29)*  07/07/23 227 lb (103 kg) (99%, Z= 2.29)*   * Growth percentiles are based on CDC (Girls, 2-20 Years) data.

## 2023-11-14 ENCOUNTER — Other Ambulatory Visit: Payer: Self-pay | Admitting: Nurse Practitioner

## 2023-11-14 DIAGNOSIS — K219 Gastro-esophageal reflux disease without esophagitis: Secondary | ICD-10-CM

## 2023-11-16 ENCOUNTER — Other Ambulatory Visit: Payer: Self-pay

## 2023-11-16 DIAGNOSIS — K219 Gastro-esophageal reflux disease without esophagitis: Secondary | ICD-10-CM

## 2023-11-16 MED ORDER — PANTOPRAZOLE SODIUM 40 MG PO TBEC: 40.0000 mg | DELAYED_RELEASE_TABLET | Freq: Two times a day (BID) | ORAL | 2 refills | Status: AC

## 2023-11-23 ENCOUNTER — Other Ambulatory Visit: Payer: Self-pay | Admitting: Nurse Practitioner

## 2023-11-23 DIAGNOSIS — G43009 Migraine without aura, not intractable, without status migrainosus: Secondary | ICD-10-CM

## 2023-11-23 NOTE — Telephone Encounter (Signed)
 Please advise [facility]

## 2023-12-02 ENCOUNTER — Ambulatory Visit: Payer: Self-pay | Admitting: Nurse Practitioner

## 2023-12-11 ENCOUNTER — Other Ambulatory Visit: Payer: Self-pay | Admitting: Gastroenterology

## 2023-12-11 DIAGNOSIS — K219 Gastro-esophageal reflux disease without esophagitis: Secondary | ICD-10-CM

## 2023-12-14 ENCOUNTER — Telehealth: Payer: Self-pay | Admitting: Neurology

## 2023-12-14 NOTE — Telephone Encounter (Signed)
 appointment cx due to conflict

## 2023-12-15 ENCOUNTER — Ambulatory Visit: Admitting: Neurology

## 2023-12-25 ENCOUNTER — Telehealth: Payer: Self-pay

## 2023-12-25 DIAGNOSIS — K219 Gastro-esophageal reflux disease without esophagitis: Secondary | ICD-10-CM

## 2023-12-25 MED ORDER — FAMOTIDINE 20 MG PO TABS: 20.0000 mg | ORAL_TABLET | Freq: Two times a day (BID) | ORAL | 0 refills | Status: DC | PRN

## 2023-12-25 NOTE — Telephone Encounter (Signed)
 Refill sent.

## 2023-12-30 ENCOUNTER — Ambulatory Visit: Payer: Medicaid Other | Admitting: Neurology

## 2024-01-04 ENCOUNTER — Ambulatory Visit: Payer: Self-pay | Admitting: Nurse Practitioner

## 2024-03-12 ENCOUNTER — Other Ambulatory Visit: Payer: Self-pay | Admitting: Nurse Practitioner

## 2024-03-12 DIAGNOSIS — K219 Gastro-esophageal reflux disease without esophagitis: Secondary | ICD-10-CM

## 2024-04-20 ENCOUNTER — Other Ambulatory Visit: Payer: Self-pay

## 2024-04-20 ENCOUNTER — Encounter (HOSPITAL_BASED_OUTPATIENT_CLINIC_OR_DEPARTMENT_OTHER): Payer: Self-pay | Admitting: Emergency Medicine

## 2024-04-20 ENCOUNTER — Emergency Department (HOSPITAL_BASED_OUTPATIENT_CLINIC_OR_DEPARTMENT_OTHER): Payer: Self-pay

## 2024-04-20 ENCOUNTER — Emergency Department (HOSPITAL_BASED_OUTPATIENT_CLINIC_OR_DEPARTMENT_OTHER)
Admission: EM | Admit: 2024-04-20 | Discharge: 2024-04-20 | Disposition: A | Discharge status: Home / Self Care | Payer: Self-pay | Attending: Emergency Medicine | Admitting: Emergency Medicine

## 2024-04-20 DIAGNOSIS — G43009 Migraine without aura, not intractable, without status migrainosus: Secondary | ICD-10-CM | POA: Insufficient documentation

## 2024-04-20 DIAGNOSIS — G43809 Other migraine, not intractable, without status migrainosus: Secondary | ICD-10-CM | POA: Diagnosis not present

## 2024-04-20 DIAGNOSIS — R519 Headache, unspecified: Secondary | ICD-10-CM | POA: Diagnosis present

## 2024-04-20 LAB — CBC WITH DIFFERENTIAL/PLATELET
Abs Immature Granulocytes: 0 K/uL (ref 0.00–0.07)
Basophils Absolute: 0 K/uL (ref 0.0–0.1)
Basophils Relative: 0 %
Eosinophils Absolute: 0.2 K/uL (ref 0.0–0.5)
Eosinophils Relative: 3 %
HCT: 39.6 % (ref 36.0–46.0)
Hemoglobin: 13.6 g/dL (ref 12.0–15.0)
Immature Granulocytes: 0 %
Lymphocytes Relative: 37 %
Lymphs Abs: 2.2 K/uL (ref 0.7–4.0)
MCH: 29.9 pg (ref 26.0–34.0)
MCHC: 34.3 g/dL (ref 30.0–36.0)
MCV: 87 fL (ref 80.0–100.0)
Monocytes Absolute: 0.6 K/uL (ref 0.1–1.0)
Monocytes Relative: 9 %
Neutro Abs: 3.1 K/uL (ref 1.7–7.7)
Neutrophils Relative %: 51 %
Platelets: 330 K/uL (ref 150–400)
RBC: 4.55 MIL/uL (ref 3.87–5.11)
RDW: 12.2 % (ref 11.5–15.5)
WBC: 6.1 K/uL (ref 4.0–10.5)
nRBC: 0 % (ref 0.0–0.2)

## 2024-04-20 LAB — COMPREHENSIVE METABOLIC PANEL WITH GFR
ALT: 17 U/L (ref 0–44)
AST: 21 U/L (ref 15–41)
Albumin: 4.4 g/dL (ref 3.5–5.0)
Alkaline Phosphatase: 59 U/L (ref 38–126)
Anion gap: 14 (ref 5–15)
BUN: 11 mg/dL (ref 6–20)
CO2: 21 mmol/L — ABNORMAL LOW (ref 22–32)
Calcium: 9.6 mg/dL (ref 8.9–10.3)
Chloride: 103 mmol/L (ref 98–111)
Creatinine, Ser: 0.65 mg/dL (ref 0.44–1.00)
GFR, Estimated: >60 mL/min (ref >60)
Glucose, Bld: 84 mg/dL (ref 70–99)
Potassium: 4.3 mmol/L (ref 3.5–5.1)
Sodium: 138 mmol/L (ref 135–145)
Total Bilirubin: 0.4 mg/dL (ref 0.0–1.2)
Total Protein: 7.9 g/dL (ref 6.5–8.1)

## 2024-04-20 LAB — PREGNANCY, URINE: Preg Test, Ur: NEGATIVE

## 2024-04-20 MED ORDER — KETOROLAC TROMETHAMINE 15 MG/ML IJ SOLN
15.0000 mg | Freq: Once | INTRAMUSCULAR | Status: AC
  Administered 2024-04-20: 15 mg via INTRAVENOUS
  Filled 2024-04-20: qty 1

## 2024-04-20 MED ORDER — DIPHENHYDRAMINE HCL 50 MG/ML IJ SOLN
25.0000 mg | Freq: Once | INTRAMUSCULAR | Status: AC
  Administered 2024-04-20: 25 mg via INTRAVENOUS
  Filled 2024-04-20: qty 1

## 2024-04-20 MED ORDER — METOCLOPRAMIDE HCL 5 MG/ML IJ SOLN
10.0000 mg | Freq: Once | INTRAMUSCULAR | Status: AC
  Administered 2024-04-20: 10 mg via INTRAVENOUS
  Filled 2024-04-20: qty 2

## 2024-04-20 MED ORDER — DEXAMETHASONE 4 MG PO TABS
10.0000 mg | ORAL_TABLET | Freq: Once | ORAL | Status: AC
  Administered 2024-04-20: 10 mg via ORAL
  Filled 2024-04-20: qty 3

## 2024-04-20 NOTE — ED Provider Notes (Signed)
 [place] [facility] AT [facility] Provider Note   CSN: [id number] Arrival date & time: 04/20/24  1140     Patient presents with: Back Pain   Natasha Sampson is a 18 y.o. female with PMH of obesity and IIH that presents today with low back pain and pressure behind her eyes that has been worsening over the last week. Patient denies any fever, chills, bowel or bladder incontinence. Patient states that she does get intermittent stiffness of all her extremities  and some slowness with her ocular movements. She states that occasionally she will lose vision but it comes right back. Patient states that she also has a history of migraine with aura. She should be follow up with neurology for this but does not seem to have followed up yet. She does endorse some nausea as well. Tylenol  and ibuprofen  are not helping the pain     Back Pain      Prior to Admission medications   Medication Sig Start Date End Date Taking? Authorizing Provider  acetaZOLAMIDE  ER (DIAMOX ) 500 MG capsule Take 1 capsule (500 mg total) by mouth 2 (two) times daily. 12/17/22 12/12/23  Gregg Lek, MD  albuterol  (VENTOLIN  HFA) 108 (90 Base) MCG/ACT inhaler Inhale 1-2 puffs into the lungs every 6 (six) hours as needed for wheezing or shortness of breath. 03/05/23   Mayer, Jodi R, NP  amitriptyline  (ELAVIL ) 25 MG tablet TAKE 1 TABLET BY MOUTH EVERYDAY AT BEDTIME 11/23/23   Paseda, Folashade R, FNP  azelastine  (ASTELIN ) 0.1 % nasal spray Place 2 sprays into both nostrils 2 (two) times daily. Use in each nostril as directed Patient not taking: Reported on 10/19/2023 09/14/23   Johnie Flaming A, NP  famotidine  (PEPCID ) 20 MG tablet TAKE 1 TABLET (20 MG TOTAL) BY MOUTH 2 (TWO) TIMES DAILY AS NEEDED FOR HEARTBURN OR INDIGESTION. 03/14/24   Paseda, Folashade R, FNP  methocarbamol  (ROBAXIN ) 500 MG tablet Take 1 tablet (500 mg total) by mouth 4 (four) times daily. Patient not taking: Reported on 10/19/2023 09/06/23    Sofia, Leslie K, PA-C  naproxen  (NAPROSYN ) 500 MG tablet Take 1 tablet (500 mg total) by mouth 2 (two) times daily with a meal. Patient not taking: Reported on 10/19/2023 09/06/23 09/05/24  Sofia, Leslie K, PA-C  ondansetron  (ZOFRAN -ODT) 4 MG disintegrating tablet Take 1 tablet (4 mg total) by mouth every 8 (eight) hours as needed. 06/10/23   Paseda, Folashade R, FNP  pantoprazole  (PROTONIX ) 40 MG tablet Take 1 tablet (40 mg total) by mouth 2 (two) times daily. 11/16/23   Paseda, Folashade R, FNP  sucralfate  (CARAFATE ) 1 g tablet Take 1 tablet (1 g total) by mouth 3 (three) times daily. Take with meals Patient not taking: Reported on 10/19/2023 03/16/23 06/10/23  Cottie Donnice PARAS, MD  SUMAtriptan  (IMITREX ) 100 MG tablet Take 1 tablet (100 mg total) by mouth every 2 (two) hours as needed for migraine. May repeat in 2 hours if headache persists or recurs. 12/17/22   Camara, Amadou, MD  topiramate  (TOPAMAX ) 100 MG tablet Take 1 tablet (100 mg total) by mouth 2 (two) times daily. 12/17/22 10/19/23  Gregg Lek, MD  TYLENOL  500 MG tablet Take 500-1,000 mg by mouth every 6 (six) hours as needed for mild pain (pain score 1-3) or headache.    [provider]    Allergies: Gadavist  [gadobutrol ]    Review of Systems  Musculoskeletal:  Positive for back pain.  As noted in HPI   Updated Vital Signs BP  126/88   Pulse 83   Temp 98.1 F (36.7 C) (Oral)   Resp 20   Wt 110.7 kg   LMP 03/23/2024   SpO2 93%   BMI 41.88 kg/m   Physical Exam Constitutional:      General: She is not in acute distress.    Appearance: She is not toxic-appearing.  Neck:     Comments: Tenderness in the paraspinal region  Cardiovascular:     Rate and Rhythm: Normal rate and regular rhythm.     Heart sounds: No murmur heard. Pulmonary:     Effort: Pulmonary effort is normal. No respiratory distress.     Breath sounds: No wheezing.  Musculoskeletal:     Comments: No paraspinal lower back tenderness.Some midline tenderness  to lumbar spine   Neurological:     General: No focal deficit present.     Mental Status: She is alert and oriented to person, place, and time.     Comments: Slow extraocular movements but otherwise no focal deficits. Strength and sensation intact      (all labs ordered are listed, but only abnormal results are displayed) Labs Reviewed  PREGNANCY, URINE  CBC WITH DIFFERENTIAL/PLATELET  COMPREHENSIVE METABOLIC PANEL WITH GFR  URINALYSIS, W/ REFLEX TO CULTURE (INFECTION SUSPECTED)    EKG: None  Radiology: No results found.   Procedures   Medications Ordered in the ED  ketorolac  (TORADOL ) 15 MG/ML injection 15 mg (has no administration in time range)  metoCLOPramide  (REGLAN ) injection 10 mg (has no administration in time range)  diphenhydrAMINE  (BENADRYL ) injection 25 mg (has no administration in time range)                                    Medical Decision Making Amount and/or Complexity of Data Reviewed Labs: ordered.   Differential includes IIH, Migraine, tension type headache, cluster headache, medication overuse headache, cervicogenic headache, TMJ, bacterial meningitis, subarachnoid hemorrhage, brain abscess   VSS. Lower differential for acute infection. Exam is reassuring and doesn't show focal neural deficits. We will get CT head to rule out any acute intracranial abnormality. Would also benefit from neurology referral for follow up of her migraines. We will get basic blood work such as CMP and CBC. Will also check UA for any signs of infection with back pain. Will give IV benadryl , reglan  and toradol  to help with migraines.  If work up is reassuring. Can follow up with neurology outpatient.   Signed out to incoming provider      Final diagnoses:  Other migraine without status migrainosus, not intractable  Migraine without aura and without status migrainosus, not intractable    ED Discharge Orders          Ordered    Ambulatory referral to Neurology        Comments: An appointment is requested in approximately: 1 week   04/20/24 1533               D'Mello, Soraiya Ahner, DO 04/20/24 1541    Tonia Chew, MD 04/20/24 1547

## 2024-04-20 NOTE — ED Triage Notes (Signed)
 Pt c/o lower back pain and neck pain over last 2 weeks, feels like my spine is out of place, blurred vision and pressure behind eyes. Endorses spinal tap in July 2024, problems since the procedure

## 2024-04-20 NOTE — Discharge Instructions (Addendum)
 The neurology clinic should call you on the phone to set up an appointment.  Please return for sudden worsening headache one-sided numbness or weakness or difficulty speech or swallowing.

## 2024-04-20 NOTE — ED Provider Notes (Signed)
 Received patient in turnover from Dr. Zavitz.  Please see their note for further details of Hx, PE.  Briefly patient is a 18 y.o. female with a Back Pain .  Patient with headache, plan for headache cocktail, reassess.  Patient is feeling much better on repeat assessment.  Will discharge home.  Neurology follow-up.    Emil Share, DO 04/20/24 [phone]

## 2024-07-04 ENCOUNTER — Ambulatory Visit: Payer: Self-pay | Admitting: Obstetrics and Gynecology

## 2024-07-22 ENCOUNTER — Institutional Professional Consult (permissible substitution): Admitting: Neurology
